# Patient Record
Sex: Female | Born: 1985 | Race: White | Hispanic: No | Marital: Married | Smoking: Never smoker
Health system: Southern US, Community
[De-identification: ages and names within clinical notes are randomized; demographics above are authoritative.]

## PROBLEM LIST (undated history)

## (undated) DIAGNOSIS — F411 Generalized anxiety disorder: Secondary | ICD-10-CM

## (undated) DIAGNOSIS — K219 Gastro-esophageal reflux disease without esophagitis: Secondary | ICD-10-CM

## (undated) DIAGNOSIS — B019 Varicella without complication: Secondary | ICD-10-CM

## (undated) DIAGNOSIS — R0781 Pleurodynia: Secondary | ICD-10-CM

## (undated) DIAGNOSIS — F419 Anxiety disorder, unspecified: Secondary | ICD-10-CM

## (undated) DIAGNOSIS — K8012 Calculus of gallbladder with acute and chronic cholecystitis without obstruction: Secondary | ICD-10-CM

## (undated) DIAGNOSIS — J309 Allergic rhinitis, unspecified: Secondary | ICD-10-CM

## (undated) DIAGNOSIS — G4733 Obstructive sleep apnea (adult) (pediatric): Secondary | ICD-10-CM

## (undated) DIAGNOSIS — E039 Hypothyroidism, unspecified: Secondary | ICD-10-CM

## (undated) HISTORY — DX: Generalized anxiety disorder: F41.1

## (undated) HISTORY — DX: Varicella without complication: B01.9

## (undated) HISTORY — PX: WISDOM TOOTH EXTRACTION: SHX21

## (undated) HISTORY — DX: Gastro-esophageal reflux disease without esophagitis: K21.9

## (undated) MED ORDER — FLUTICASONE 50 MCG/ACTUATION NASAL SPRAY, SUSP
50 mcg/actuation | Freq: Every day | NASAL | Status: DC
Start: ? — End: 2016-04-24

---

## 2009-09-26 MED ORDER — CYCLOBENZAPRINE 10 MG TAB
10 mg | ORAL_TABLET | Freq: Three times a day (TID) | ORAL | Status: DC | PRN
Start: 2009-09-26 — End: 2016-04-24

## 2009-09-26 NOTE — Patient Instructions (Addendum)
English   Spanish  Neck Spasm: Exercises  Your Care Instructions  Here are some examples of typical rehabilitation exercises for your condition. Start each exercise slowly. Ease off the exercise if you start to have pain.  Your doctor or physical therapist will tell you when you can start these exercises and which ones will work best for you.  How to do the exercises  Levator scapula stretch      1. Sit in a firm chair, or stand up straight.   2. Gently tilt your head toward your left shoulder.   3. Turn your head to look down into your armpit, bending your head slightly forward. Let the weight of your head stretch your neck muscles.   4. Hold for 15 to 30 seconds.   5. Return to your starting position.   6. Follow the same instructions above, but tilt your head toward your right shoulder.   7. Repeat 2 to 4 times toward each shoulder.  Upper trapezius stretch      1. Sit in a firm chair, or stand up straight.   2. This stretch works best if you keep your shoulder down as you lean away from it. To help you remember to do this, start by relaxing your shoulders and lightly holding on to your thighs or your chair.   3. Tilt your head toward your shoulder and hold for 15 to 30 seconds. Let the weight of your head stretch your muscles.   4. If you would like a little added stretch, place your arm behind your back. Use the arm opposite of the direction you are tilting your head. For example, if you are tilting your head to the left, place your right arm behind your back.   5. Repeat 2 to 4 times toward each shoulder.  Neck rotation      1. Sit in a firm chair, or stand up straight.   2. Keeping your chin level, turn your head to the right, and hold for 15 to 30 seconds.   3. Turn your head to the left, and hold for 15 to 30 seconds.   4. Repeat 2 to 4 times to each side.  Chin tuck      1. Lie on the floor with a rolled-up towel under your neck. Your head should be touching the floor.    2. Slowly bring your chin toward the front of your neck.   3. Hold for a count of 6, and then relax for up to 10 seconds.   4. Repeat 8 to 12 times.  Forward neck flexion      1. Sit in a firm chair, or stand up straight.   2. Bend your head forward.   3. Hold for 15 to 30 seconds, then return to your starting position.   4. Repeat 2 to 4 times.  Follow-up care is a key part of your treatment and safety. Be sure to make and go to all appointments, and call your doctor if you are having problems. It's also a good idea to know your test results and keep a list of the medicines you take.     Where can you learn more?   Go to [email or web address]  Enter P962 in the search box to learn more about "Neck Spasm: Exercises."   ?? 1995-2010 [employer]. Care instructions adapted under license by [employer] (which disclaims liability or warranty for this information). This care instruction is for use with your licensed healthcare  professional. If you have questions about a medical condition or this instruction, always ask your healthcare professional. Healthwise disclaims any warranty or liability for your use of this information.  Content Version: 8.8.72453; Last Revised: May 10, 2010English   Spanish  Neck: Exercises  Your Care Instructions  Here are some examples of typical rehabilitation exercises for your condition. Start each exercise slowly. Ease off the exercise if you start to have pain.  Your doctor or physical therapist will tell you when you can start these exercises and which ones will work best for you.  How to do the exercises  Note: Stretching should make you feel a gentle stretch, but no pain. Stop any strengthening exercise that makes pain worse.  Neck stretch      1. This stretch works best if you keep your shoulder down as you lean away from it. To help you remember to do this, start by relaxing your shoulders and lightly holding on to your thighs or your chair.    2. Tilt your head toward your shoulder and hold for 15 to 30 seconds. Let the weight of your head stretch your muscles.   3. If you would like a little added stretch, use your hand to gently and steadily pull your head toward your shoulder. For example, keeping your right shoulder down, lean your head to the left.   4. Repeat 2 to 4 times toward each shoulder.  Diagonal neck stretch      1. Turn your head slightly toward the direction you will be stretching, and tilt your head diagonally toward your chest and hold for 15 to 30 seconds.   2. If you would like a little added stretch, use your hand to gently and steadily pull your head forward on the diagonal.   3. Repeat 2 to 4 times toward each side.  Dorsal glide stretch      1. Sit or stand tall and look straight ahead.   2. Slowly tuck your chin as you glide your head backward over your body   3. Hold for a count of 6, and then relax for up to 10 seconds.   4. Repeat 8 to 12 times.  Note: The dorsal glide stretches the back of the neck. If you feel pain, do not glide so far back. Some people find this exercise easier to do while lying on their backs with an ice pack on the neck.  Chest and shoulder stretch      1. Sit or stand tall and glide your head backward as in the dorsal glide stretch.   2. Raise both arms so that your hands are next to your ears.   3. Take a deep breath, and as you breathe out, lower your elbows down and behind your back. You will feel your shoulder blades slide down and together, and at the same time you will feel a stretch across your chest and the front of your shoulders.   4. Hold for about 6 seconds, and then relax for up to 10 seconds.   5. Repeat 8 to 12 times.  Strengthening: Hands on head      1. Move your head backward, forward, and side to side against gentle pressure from your hands, holding each position for about 6 seconds.   2. Repeat 8 to 12 times.   Follow-up care is a key part of your treatment and safety. Be sure to make and go to all appointments, and call your doctor if you  are having problems. It's also a good idea to know your test results and keep a list of the medicines you take.     Where can you learn more?   Go to [email or web address]  Enter P975 in the search box to learn more about "Neck: Exercises."   ?? 1995-2010 [employer], [employer]. Care instructions adapted under license by [employer] (which disclaims liability or warranty for this information). This care instruction is for use with your licensed healthcare professional. If you have questions about a medical condition or this instruction, always ask your healthcare professional. Healthwise disclaims any warranty or liability for your use of this information.  Content Version: 8.8.72453; Last Revised: April 09, 2008      MyChart Activation    Thank you for requesting access to MyChart. Please follow the instructions below to securely access and download your online medical record. MyChart allows you to send messages to your doctor, view your test results, renew your prescriptions, schedule appointments, and more.    How Do I Sign Up?    1. In your internet browser, go to [email or web address].  2. Click on the First Time User? Click Here link in the Sign In box. You will see the New Member Sign Up page.  3. Enter your MyChart Access Code exactly as it appears below. You will not need to use this code after you???ve completed the sign-up process. If you do not sign up before the expiration date, you must request a new code.    MyChart Access Code: [email or web address]  Expires: 11/25/09 10:22 AM     4. Enter the last four digits of your Social Security Number (xxxx) and Date of Birth (mm/dd/yyyy) as indicated and click Submit. You will be taken to the next sign-up page.   5. Create a MyChart ID. This will be your MyChart login ID and cannot be changed, so think of one that is secure and easy to remember.  6. Create a MyChart password. You can change your password at any time.  7. Enter your Password Reset Question and Answer. This can be used at a later time if you forget your password.   8. Enter your e-mail address. You will receive e-mail notification when new information is available in MyChart.  9. Click Sign Up. You can now view and download portions of your medical record.  10. Click the Download Summary menu link to download a portable copy of your medical information.    Additional Information    If you have questions, please call [phone]. Remember, MyChart is NOT to be used for urgent needs. For medical emergencies, dial 911.

## 2009-09-26 NOTE — Progress Notes (Signed)
Pt here today to establish care and to address neck pain which pt assumes is related to stress

## 2009-09-26 NOTE — Progress Notes (Signed)
HISTORY OF PRESENT ILLNESS  Erin Townsend is a 24 y.o. female.  HPI  Presents to est care  Neck pain for a several months - at least since last summer  No injury, no repetitive activity  Most prominent in AM or late in the evening  Self massage has temporary relief  No meds taken.  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    In school full time and working.    Dx IBS in the past - but sx now stable.    Planning to get pregnant.    Past medical, Social, and Family history reviewed  Medications reviewed and updated.    ROS  Complete ROS reviewed and negative except as noted in HPI.    Physical Exam   Nursing note and vitals reviewed.  Constitutional: She is oriented to person, place, and time. She appears well-nourished. No distress.   HENT:   Head: Normocephalic and atraumatic.   Mouth/Throat: Oropharynx is clear and moist. No oropharyngeal exudate.   Eyes: Extraocular motions are normal. Pupils are equal, round, and reactive to light. No scleral icterus.   Neck: Normal range of motion. Neck supple. No JVD present.   Cardiovascular: Normal rate, regular rhythm and normal heart sounds.  Exam reveals no gallop and no friction rub.    No murmur heard.  Pulmonary/Chest: Effort normal and breath sounds normal. No respiratory distress. She has no wheezes. She has no rales.   Abdominal: Soft. Bowel sounds are normal. She exhibits no distension and no mass. No tenderness. She has no guarding.   Musculoskeletal: Normal range of motion. She exhibits no edema.   Lymphadenopathy:     She has no cervical adenopathy.   Neurological: She is alert and oriented to person, place, and time. She exhibits normal muscle tone. Coordination normal.   Skin: Skin is warm. No rash noted.    Psychiatric: She has a normal mood and affect.       ASSESSMENT and PLAN  1. Neck pain (723.1B)  cyclobenzaprine (FLEXERIL) 10 mg tablet, CBC WITH AUTOMATED DIFF, METABOLIC PANEL, COMPREHENSIVE, CK, XR SPINE CERVICAL COMP WITH OBLIQUES   2. Anxiety (300.00E)  REFERRAL TO PSYCHOLOGY, T4, FREE, TSH, 3RD GENERATION   3. IBS (irritable bowel syndrome) (564.1W)     4. Allergic rhinitis (477.9AD)  cetirizine (ZYRTEC) 10 mg tablet   5. Lipid screening (V77.91D)  LIPID PANEL   6. Pregnancy examination or test (V72.40D)  AMB POC URINE PREGNANCY TEST, VISUAL COLOR COMPARISON       Follow-up Disposition:  Return in about 3 months (around 12/27/2009), or if symptoms worsen or fail to improve.  lab results and schedule of future lab studies reviewed with patient  reviewed diet, exercise and weight control  cardiovascular risk and specific lipid/LDL goals reviewed  reviewed medications and side effects in detail  Flexeril - preg class B  Heat and NSAIDs as well  Neck exercises  Counseled re: sleep position and mechanics  Ref counseling  Reluctant to try SSRI or other med for fear of side effects  Pt to let us know re: Td vs Tdap and date given

## 2009-09-27 LAB — CBC WITH AUTOMATED DIFF
ABS. BASOPHILS: 0 10*3/uL (ref 0.0–0.2)
ABS. EOSINOPHILS: 0.2 10*3/uL (ref 0.0–0.4)
ABS. IMM. GRANS.: 0 10*3/uL (ref 0.0–0.1)
ABS. LYMPHOCYTES: 1.5 10*3/uL (ref 0.7–4.5)
ABS. MONOCYTES: 0.4 10*3/uL (ref 0.1–1.0)
ABS. NEUTROPHILS: 4.1 10*3/uL (ref 1.8–7.8)
BASOPHILS: 0 % (ref 0–3)
EOSINOPHILS: 3 % (ref 0–7)
HCT: 35.6 % (ref 34.0–44.0)
HGB: 12.1 g/dL (ref 11.5–15.0)
IMMATURE GRANULOCYTES: 0 % (ref 0–1)
LYMPHOCYTES: 25 % (ref 14–46)
MCH: 31.3 pg (ref 27.0–34.0)
MCHC: 34 g/dL (ref 32.0–36.0)
MCV: 92 fL (ref 80–98)
MONOCYTES: 6 % (ref 4–13)
NEUTROPHILS: 66 % (ref 40–74)
PLATELET: 219 10*3/uL (ref 140–415)
RBC: 3.87 x10E6/uL (ref 3.80–5.10)
RDW: 12.7 % (ref 11.7–15.0)
WBC: 6.2 10*3/uL (ref 4.0–10.5)

## 2009-09-27 LAB — METABOLIC PANEL, COMPREHENSIVE
A-G Ratio: 1.4 (ref 1.1–2.5)
ALT (SGPT): 11 IU/L (ref 0–40)
AST (SGOT): 10 IU/L (ref 0–40)
Albumin: 4.3 g/dL (ref 3.5–5.5)
Alk. phosphatase: 62 IU/L (ref 25–150)
BUN/Creatinine ratio: 14 (ref 8–20)
BUN: 9 mg/dL (ref 6–20)
Bilirubin, total: 0.3 mg/dL (ref 0.0–1.2)
CO2: 23 mmol/L (ref 20–32)
Calcium: 9.1 mg/dL (ref 8.7–10.2)
Chloride: 104 mmol/L (ref 97–108)
Creatinine: 0.63 mg/dL (ref 0.57–1.00)
GLOBULIN, TOTAL: 3.1 g/dL (ref 1.5–4.5)
Glucose: 88 mg/dL (ref 65–99)
Potassium: 4 mmol/L (ref 3.5–5.2)
Protein, total: 7.4 g/dL (ref 6.0–8.5)
Sodium: 138 mmol/L (ref 135–145)

## 2009-09-27 LAB — CK: Creatine Kinase,Total: 55 U/L (ref 24–173)

## 2009-09-27 LAB — LIPID PANEL
Cholesterol, total: 151 mg/dL (ref 100–199)
HDL Cholesterol: 40 mg/dL (ref >39)
LDL, calculated: 94 mg/dL (ref 0–99)
Triglyceride: 83 mg/dL (ref 0–149)
VLDL, calculated: 17 mg/dL (ref 5–40)

## 2009-09-27 LAB — TSH 3RD GENERATION: TSH: 1.71 u[IU]/mL (ref 0.450–4.500)

## 2009-09-27 LAB — T4, FREE: T4, Free: 1.2 ng/dL (ref 0.82–1.77)

## 2011-04-16 NOTE — Progress Notes (Signed)
Influenza vaccination.

## 2012-09-03 ENCOUNTER — Telehealth

## 2012-09-03 NOTE — Telephone Encounter (Signed)
Benadryl is contraindicated in breast feeding due to both excretion into the milk and infant sedation or side effects and it may also dry up the breast milk a little.  Other antihistamines like claritin are also excreted in small amounts into breast milk, so theoretically could cause a problem.    astelin has no testing to support it either way.    Nasal steroids like flonase have not been tested, but inhaled steroids are generally considered ok.  So, starting flonase may be the safest and happens to be the best treatment for allergies as well.    flonase must be used daily to be effective and should show results in 4-5 days    I can send a Rx to the pharmacy.    Please notify pt.

## 2012-09-03 NOTE — Telephone Encounter (Incomplete)
Patient has a bad cold and wants to know what medications over the counter she could take to help her. Please contact patient at [phone].

## 2012-09-03 NOTE — Telephone Encounter (Signed)
Pt calling back for a second time, but message was never routed. She states that she is currently nursing her child, but has allergies/coughing/congestion. She just wants to know if it is okay for her to take Benadryl while she is nursing.    She would like a call back regarding this, at [phone].

## 2012-09-04 NOTE — Telephone Encounter (Signed)
msg left notifying pt to call office to go over recommendations that Dr. Langston Masker has

## 2013-11-26 MED ORDER — ALPRAZOLAM 0.25 MG TAB
0.25 mg | ORAL_TABLET | Freq: Two times a day (BID) | ORAL | Status: DC | PRN
Start: 2013-11-26 — End: 2014-04-16

## 2013-11-26 MED ORDER — CITALOPRAM 40 MG TAB
40 mg | ORAL_TABLET | Freq: Every day | ORAL | Status: DC
Start: 2013-11-26 — End: 2014-12-04

## 2013-11-26 NOTE — Progress Notes (Signed)
Subjective:   28 y.o. female for annual medical exam.   Her gyne and breast care is done elsewhere by her Ob-Gyne physician.    Past medical, Social, and Family history reviewed  Medications reviewed and updated.     ROS: Feeling generally well. No TIA's or unusual headaches, no dysphagia.  No prolonged cough. No dyspnea or chest pain on exertion.  No abdominal pain, change in bowel habits, black or bloody stools.  No urinary tract symptoms.      Specific concerns today:   Anxiety - stable, but uses xanax 1-2 x per week      Neck pain is not resolved, but better with exercise, etc.  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  BP 113/77 mmHg   Pulse 78   Temp(Src) 98.5 ??F (36.9 ??C) (Oral)   Resp 20   Ht 5' 2.5" (1.588 m)   Wt 151 lb (68.493 kg)   BMI 27.16 kg/m2   SpO2 99%   LMP 10/30/2013 (Approximate)  ENT normal.  Neck supple. No adenopathy or thyromegaly. PERLA. Lungs are clear, good air entry, no wheezes, rhonchi or rales. S1 and S2 normal, no murmurs, regular rate and rhythm. Abdomen soft without tenderness, guarding, mass or organomegaly. Extremities show no edema, normal peripheral pulses. Neurological is normal, no focal findings.  Breast and Pelvic exams are deferred.      Prior labs reviewed.      Assessment/Plan:   Well Woman  follow low fat diet, routine labs ordered, call if any problems    ICD-9-CM    1. Well woman exam V70.0 CBC WITH AUTOMATED DIFF     LIPID PANEL     METABOLIC PANEL, COMPREHENSIVE   2. Anxiety 300.00 citalopram (CELEXA) 40 mg tablet     ALPRAZolam (XANAX) 0.25 mg tablet   3. Allergic rhinitis, unspecified allergic rhinitis type 477.9    4. Neck pain 723.1 XR SPINE CERV 4 OR 5 V   5. IBS (irritable bowel syndrome) 564.1    6. 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  lab results and schedule of future lab studies reviewed with patient  reviewed diet, exercise and weight    cardiovascular risk and specific lipid/LDL goals reviewed  reviewed medications and side effects in detail   Neck films re-ordered  [facility]  Reviewed options for milk intolerance  Agree to manage SSRI and benzo prn  If difficulty managing mood then re-refer to psych

## 2013-11-26 NOTE — Progress Notes (Signed)
Room 15. Patient presents for physical, pt has been having abdominal pain/cramps when drinking milk. Pt has no c/o pain or illness at this time. In addition to med list, pt is taking birth control pills, unsure of name. Pt does not have Advanced Directive.       1. Have you been to the ER, urgent care clinic since your last visit?  Hospitalized since your last visit?No    2. Have you seen or consulted any other health care providers outside of the [facility] since your last visit?  Include any pap smears or colon screening. Yes When: Jan 2015 psychiatrist Dr. Corwin LevinsMarkowitz. Also OBGYN 1 month ago.

## 2014-01-05 NOTE — Progress Notes (Signed)
Patient in for fasting labs.

## 2014-01-06 LAB — CBC WITH AUTOMATED DIFF
ABS. BASOPHILS: 0 10*3/uL (ref 0.0–0.2)
ABS. EOSINOPHILS: 0.1 10*3/uL (ref 0.0–0.4)
ABS. IMM. GRANS.: 0 10*3/uL (ref 0.0–0.1)
ABS. MONOCYTES: 0.5 10*3/uL (ref 0.1–0.9)
ABS. NEUTROPHILS: 1.9 10*3/uL (ref 1.4–7.0)
Abs Lymphocytes: 2.9 10*3/uL (ref 0.7–3.1)
BASOPHILS: 1 %
EOSINOPHILS: 1 %
HCT: 35.5 % (ref 34.0–46.6)
HGB: 11.7 g/dL (ref 11.1–15.9)
IMMATURE GRANULOCYTES: 0 %
Lymphocytes: 53 %
MCH: 30 pg (ref 26.6–33.0)
MCHC: 33 g/dL (ref 31.5–35.7)
MCV: 91 fL (ref 79–97)
MONOCYTES: 9 %
NEUTROPHILS: 36 %
PLATELET: 175 10*3/uL (ref 150–379)
RBC: 3.9 x10E6/uL (ref 3.77–5.28)
RDW: 13.7 % (ref 12.3–15.4)
WBC: 5.4 10*3/uL (ref 3.4–10.8)

## 2014-01-06 LAB — METABOLIC PANEL, COMPREHENSIVE
A-G Ratio: 1.4 (ref 1.1–2.5)
ALT (SGPT): 33 IU/L — ABNORMAL HIGH (ref 0–32)
AST (SGOT): 21 IU/L (ref 0–40)
Albumin: 4.2 g/dL (ref 3.5–5.5)
Alk. phosphatase: 59 IU/L (ref 39–117)
BUN/Creatinine ratio: 18 (ref 8–20)
BUN: 13 mg/dL (ref 6–20)
Bilirubin, total: 0.3 mg/dL (ref 0.0–1.2)
CO2: 23 mmol/L (ref 18–29)
Calcium: 8.7 mg/dL (ref 8.7–10.2)
Chloride: 101 mmol/L (ref 97–108)
Creatinine: 0.72 mg/dL (ref 0.57–1.00)
GLOBULIN, TOTAL: 3 g/dL (ref 1.5–4.5)
Glucose: 88 mg/dL (ref 65–99)
Potassium: 3.9 mmol/L (ref 3.5–5.2)
Protein, total: 7.2 g/dL (ref 6.0–8.5)
Sodium: 139 mmol/L (ref 134–144)

## 2014-01-06 LAB — LIPID PANEL
Cholesterol, total: 170 mg/dL (ref 100–199)
HDL Cholesterol: 33 mg/dL — ABNORMAL LOW (ref >39)
LDL, calculated: 93 mg/dL (ref 0–99)
Triglyceride: 222 mg/dL — ABNORMAL HIGH (ref 0–149)
VLDL, calculated: 44 mg/dL — ABNORMAL HIGH (ref 5–40)

## 2014-03-18 ENCOUNTER — Ambulatory Visit
Admit: 2014-03-18 | Discharge: 2014-03-18 | Payer: PRIVATE HEALTH INSURANCE | Attending: Internal Medicine | Primary: Internal Medicine

## 2014-03-18 DIAGNOSIS — R0781 Pleurodynia: Secondary | ICD-10-CM

## 2014-03-18 LAB — D-DIMER, QUANTITATIVE: D-Dimer, Quant: 1.6 mg/L FEU — ABNORMAL HIGH (ref 0.00–0.49)

## 2014-03-18 LAB — D DIMER: D-dimer: 1.6 mg/L FEU — ABNORMAL HIGH (ref 0.00–0.49)

## 2014-03-18 LAB — AMB POC SOFIA INFLUENZA A/B TEST
Influenza A Ag POC: NEGATIVE Pos/Neg
Influenza B Ag POC: NEGATIVE Pos/Neg

## 2014-03-18 NOTE — Telephone Encounter (Signed)
[facility], call [employer] to get approval for imaging, if not to have pt go to ER if having bad chest pains.

## 2014-03-18 NOTE — Telephone Encounter (Signed)
S/w pt husband, explained that [facility] need to see if we can get it approved, it may take 24- 48 hr for a PA. If she is having bad chest pain, she  needs to be evaluated sooner she can go to Er to be seen.  Phone went dead.    Called pt back at [phone], l/m to have pt to call back office  Called back to [phone], s/w husband, states he hung up phone, he will have insurance call office to figure it out.

## 2014-03-18 NOTE — Telephone Encounter (Signed)
Spoke with ED provider Dr. Skipper Clicheutter at [phone] (main number) of [facility] (pt's preferred ED location for CT):    Reviewed eval today and plan for evaluation there with CT.  Given D-dimer result and plan for CT.    Provider there voiced understanding of plan.

## 2014-03-18 NOTE — Patient Instructions (Signed)
Obtain chest x-ray today as reviewed.  Labs, once resulted, will be called to you.  If elevated/abnormal, will order/recommend additional imaging as reviewed today at visit.    If pain or shortness of breath worsens, call/return to clinic, or have evaluation in urgent care/ED as reviewed.

## 2014-03-18 NOTE — Progress Notes (Signed)
RM # 17      Patient in today with complain of chest pain without shortness of breath. She states it started this morning about 8:00 a.m. When she woke up.  Patient describes it as a "tightness in the center of my chest".  Patient does not complain of N&V, neck or arm pain.  Pain is mostly when she inhales.    1. Have you been to the ER, urgent care clinic since your last visit?  Hospitalized since your last visit?No    2. Have you seen or consulted any other health care providers outside of the [facility] since your last visit?  Include any pap smears or colon screening. No     Patient received her flu vaccine in September at her place of employment, she will provide copy to this practice.    Patient does not have an advance directive or living will in place, patient declined information.

## 2014-03-18 NOTE — Telephone Encounter (Signed)
Call made tp pt insurance s/w Cyndi Benderen H., Ref # [phone]. Per rep does not require a PA for the CT imaging cpt code [id number].       Called made to Concierge while on the phone with husband to get pt scheduled for this evening 10/22. Per husband's request pt was scheduled for 10/23 in the morning.  He states, " pt is at work and tomorrow will work better".    Number given to the concierge in case she needs to reschedule also info given to the [place] imaging location.

## 2014-03-18 NOTE — Telephone Encounter (Signed)
Spoke with pt at 1900--reviewed findings and plan.    Reviewed elevated D-dimer, possible pulmonary embolus/clot as etiology of pain, and risk of not having evaluated ASAP.  She was readily agreeable to additional eval at this time.  Reviewed significance of elevation in lab (D-dimer) and need to eval pain with CT tonight.    [facility]  [facility] to review plan and evaluation.    Reviewed with pt to let them know Dr. Langston MaskerMorris PCP, so we could retrieve records tomorrow.

## 2014-03-18 NOTE — Progress Notes (Signed)
HISTORY OF PRESENT ILLNESS  Erin Townsend is a 28 y.o. female.    HPI  Here for evaluation chest pain.    Noted this AM only.  She awakened from sleep and felt normal, then noted pain later after waking up.  She notes pain in chest like a tightening when she breathes.    Notes primarily in upper mid-chest.  Worse with deeper breathing hurts more.  She notes can't "catch a good breath".  No history of wheezing noted.    Notes has had cough and sniffles for past few days.  No fever. No rhinorrhea.  No productive cough.    No prior chest problems/injuries, breathing problems.  No cardiac problems noted.  She notes seen by a specialist when younger, for fast heart rate--this hasn't happened in years.  She notes had EKG and 24-hr Holter which she reported showed "skipped beats".  This notes when ~[age]yrs.        ROS      Blood pressure 100/60, pulse 102, temperature 98.4 ??F (36.9 ??C), temperature source Oral, resp. rate 24, height 5' 2.5" (1.588 m), weight 150 lb 3.2 oz (68.13 kg), last menstrual period 03/18/2014, SpO2 98 %.    Physical Exam   Constitutional: She appears well-developed and well-nourished. No distress.   HENT:   Head: Normocephalic and atraumatic.   Right Ear: External ear normal.   Left Ear: External ear normal.   Mouth/Throat: Oropharynx is clear and moist. No oropharyngeal exudate.   Eyes: Conjunctivae are normal. Right eye exhibits no discharge. Left eye exhibits no discharge. No scleral icterus.   Neck: Normal range of motion. Neck supple. No tracheal deviation present. No thyromegaly present.   Cardiovascular: Normal rate, regular rhythm, normal heart sounds and intact distal pulses.  Exam reveals no gallop and no friction rub.    No murmur heard.  Pulmonary/Chest: Effort normal and breath sounds normal. No stridor. No respiratory distress. She has no wheezes. She has no rales. She exhibits no tenderness.   No forced expiratory wheezes noted bilaterally.    Abdominal: Soft. Bowel sounds are normal. She exhibits no distension. There is no tenderness. There is no rebound and no guarding.   Musculoskeletal: She exhibits no edema or tenderness.   No calf pain, pain with foot dorsiflexion noted bilat.  No calf or LE swelling noted bilat.   Lymphadenopathy:     She has no cervical adenopathy.   Neurological: She is alert. She exhibits normal muscle tone. Coordination normal.   Skin: Skin is warm. No rash noted. She is not diaphoretic. No erythema. No pallor.   Psychiatric: She has a normal mood and affect. Her behavior is normal. Judgment and thought content normal.     EKG reviewed at visit--normal.  HR 86 on EKG with no ST-T changes.      Results for orders placed or performed in visit on 03/18/14   AMB POC SOFIA INFLUENZA A/B TEST   Result Value Ref Range    VALID INTERNAL CONTROL POC Yes     Influenza A Ag POC Negative Pos/Neg    Influenza B Ag POC Negative Pos/Neg         ASSESSMENT and PLAN      ICD-10-CM ICD-9-CM    1. Pleuritic chest pain R07.81 786.52 AMB POC SOFIA INFLUENZA A/B TEST      AMB POC EKG ROUTINE W/ 12 LEADS, INTER & REP      D DIMER      CBC WITH  AUTOMATED DIFF      XR CHEST PA LAT      CTA CHEST W WO CONT--ordered once D-dimer resulted/elevated.   2. History of tachycardia Z87.898 V13.89 AMB POC EKG ROUTINE W/ 12 LEADS, INTER & REP   3. Viral URI with cough J06.9 465.9 AMB POC SOFIA INFLUENZA A/B TEST      XR CHEST PA LAT       1.  Eval reviewed with pt during visit.  Reviewed with pt PCP after visit in clinic.    2.  EKG normal.  Reviewed with pt--copy to pt at visit.    3.  Reviewed CXR.  Influenza testing completed after pt left clinic--negative.        Follow-up Disposition:  Return if symptoms worsen or fail to improve.  lab results and schedule of future lab studies reviewed with patient  reviewed medications and side effects in detail  radiology results and schedule of future radiology studies reviewed with patient     For additional documentation of information and/or recommendations discussed this visit, please see notes in instructions.      Addendum--day of visit--1540:  Unable to reach pt on her listed number: [phone].  VM left.  Unable to reach husband on his number (on HIPAA)--also left VM to contact clinic.  Husband's number [phone].

## 2014-03-19 ENCOUNTER — Inpatient Hospital Stay: Payer: PRIVATE HEALTH INSURANCE | Primary: Internal Medicine

## 2014-03-19 LAB — CBC WITH AUTOMATED DIFF
ABS. BASOPHILS: 0 10*3/uL (ref 0.0–0.2)
ABS. EOSINOPHILS: 0.1 10*3/uL (ref 0.0–0.4)
ABS. IMM. GRANS.: 0 10*3/uL (ref 0.0–0.1)
ABS. MONOCYTES: 0.5 10*3/uL (ref 0.1–0.9)
ABS. NEUTROPHILS: 4 10*3/uL (ref 1.4–7.0)
Abs Lymphocytes: 1.7 10*3/uL (ref 0.7–3.1)
BASOPHILS: 0 %
EOSINOPHILS: 2 %
HCT: 36.9 % (ref 34.0–46.6)
HGB: 12.6 g/dL (ref 11.1–15.9)
IMMATURE GRANULOCYTES: 0 %
Lymphocytes: 27 %
MCH: 29.8 pg (ref 26.6–33.0)
MCHC: 34.1 g/dL (ref 31.5–35.7)
MCV: 87 fL (ref 79–97)
MONOCYTES: 8 %
NEUTROPHILS: 63 %
PLATELET: 192 10*3/uL (ref 150–379)
RBC: 4.23 x10E6/uL (ref 3.77–5.28)
RDW: 13.6 % (ref 12.3–15.4)
WBC: 6.4 10*3/uL (ref 3.4–10.8)

## 2014-03-19 LAB — AMB EXT CREATININE
Creatinine, External: 0.8
Creatinine, External: 0.84

## 2014-03-19 NOTE — Telephone Encounter (Signed)
[facility] records reveiwed/requested.    CT done to rule out Pulmonary embolus and normal study.  Pt had normal vitals there with pulse ox 98-100%.  Normal CMP, Urine pregnancy, POC BMP, POC troponin.  POC Hgb/Hct also low consiistent with heme 8.    Heme 8 with hgb 11.6, plt 169K (with low-normal 175K).     Treated with hydrocodone/acetaminophen PRN for costochondritie, MSK pain.

## 2014-04-01 ENCOUNTER — Encounter

## 2014-04-17 MED ORDER — ALPRAZOLAM 0.25 MG TAB
0.25 mg | ORAL_TABLET | ORAL | Status: DC
Start: 2014-04-17 — End: 2014-06-01

## 2014-04-17 NOTE — Telephone Encounter (Signed)
Medication refill authorized.    Please call in since cannot be sent electronically

## 2014-04-19 NOTE — Progress Notes (Signed)
rx called into pharmacy

## 2014-06-02 MED ORDER — ALPRAZOLAM 0.25 MG TAB
0.25 mg | ORAL_TABLET | ORAL | Status: DC
Start: 2014-06-02 — End: 2014-09-13

## 2014-06-02 NOTE — Telephone Encounter (Signed)
Medication refill authorized.    Please call in since cannot be sent electronically

## 2014-06-02 NOTE — Telephone Encounter (Signed)
Rx called into pharmacy

## 2014-06-11 ENCOUNTER — Ambulatory Visit
Admit: 2014-06-11 | Discharge: 2014-06-11 | Payer: PRIVATE HEALTH INSURANCE | Attending: Internal Medicine | Primary: Internal Medicine

## 2014-06-11 DIAGNOSIS — H109 Unspecified conjunctivitis: Secondary | ICD-10-CM

## 2014-06-11 MED ORDER — CIPROFLOXACIN 0.3 % EYE DROPS
0.3 % | Freq: Four times a day (QID) | OPHTHALMIC | Status: AC
Start: 2014-06-11 — End: 2014-06-16

## 2014-06-11 MED ORDER — AZITHROMYCIN 250 MG TAB
250 mg | ORAL_TABLET | ORAL | Status: DC
Start: 2014-06-11 — End: 2014-12-05

## 2014-06-11 NOTE — Patient Instructions (Addendum)
Once eye symptoms (redness, drainage) resolve off eye drops for 1-2 days, may resume contact wear with new contacts.  Continue glasses wear until then.      Pinkeye From Bacteria in Teens: Care Instructions  Your Care Instructions     Erin Townsend is a problem that many teens get. In pinkeye, the lining of your eyelid and the eye surface become red and swollen. The lining is called the conjunctiva (say "kawn-junk-TY-vuh"). Pinkeye is also called conjunctivitis (say "kun-JUNK-tih-VY-tus").  Pinkeye can be caused by bacteria, a virus, or an allergy.  Your pinkeye is caused by bacteria. This type of pinkeye can spread quickly from person to person, usually from touching.  Pinkeye from bacteria usually clears up 2 to 3 days after you start treatment with antibiotic eyedrops or ointment.  Follow-up care is a key part of your treatment and safety. Be sure to make and go to all appointments, and call your doctor if you are having problems. It's also a good idea to know your test results and keep a list of the medicines you take.  How can you care for yourself at home?  Use antibiotics as directed  If the doctor gave you antibiotic medicine, such as an ointment or eyedrops, use it as directed. Do not stop using it just because your eyes start to look better. You need to take the full course of antibiotics. Keep the bottle tip clean.  To put in eyedrops or ointment:  ?? Tilt your head back and pull your lower eyelid down with one finger.  ?? Drop or squirt the medicine inside the lower lid.  ?? Close your eye for 30 to 60 seconds to let the drops or ointment move around.  ?? Do not touch the tip of the bottle or tube to your eye, eyelid, eyelashes, or any other surface.  Make yourself comfortable  ?? Use moist cotton or a clean, wet cloth to remove the crust from your eyes. Wipe from the inside corner of your eye to the outside. Use a clean part of the cloth for each wipe.   ?? Put cold or warm wet cloths on your eyes a few times a day if your eyes hurt or are itching.  ?? Do not wear contact lenses until your pinkeye is gone. Clean the contacts and storage case.  ?? If you wear disposable contacts, get out a new pair when your eyes have cleared and it is safe to wear contacts again.  Prevent pinkeye from spreading  ?? Wash your hands often. Always wash them before and after you treat pinkeye or touch your eyes or face.  ?? Don't share towels, pillows, or washcloths while you have pinkeye. Use clean linens, towels, and washcloths each day.  ?? Do not share your contact lens equipment, containers, or solutions.  ?? Do not share your eye medicine.  When should you call for help?  Call your doctor now or seek immediate medical care if:  ?? You have pain in your eye, not just irritation on the surface.  ?? You have a change in vision or a loss of vision.  ?? Your eye gets worse or is not better within 48 hours after you started antibiotics.  Watch closely for changes in your health, and be sure to contact your doctor if you have any problems.   Where can you learn more?   Go to [email or web address]  Enter F054 in the search box to learn more about "Pinkeye From Bacteria in  Teens: Care Instructions."   ?? 2006-2015 [employer]. Care instructions adapted under license by [employer] (which disclaims liability or warranty for this information). This care instruction is for use with your licensed healthcare professional. If you have questions about a medical condition or this instruction, always ask your healthcare professional. Healthwise, Incorporated disclaims any warranty or liability for your use of this information.  Content Version: 10.7.482551; Current as of: Oct 16, 2013

## 2014-06-11 NOTE — Progress Notes (Signed)
Room 12    Chief Complaint   Patient presents with   ??? Pink Eye       Patient states this morning there was a lot of drainage crusted around her eyes. Itches sometimes.      1. Have you been to the ER, urgent care clinic since your last visit?  Hospitalized since your last visit?[facility] for chest pain. Release signed.    2. Have you seen or consulted any other health care providers outside of the [facility] since your last visit?  Include any pap smears or colon screening. No     Advanced care info added to AVS.

## 2014-06-11 NOTE — Progress Notes (Addendum)
HISTORY OF PRESENT ILLNESS  Erin Townsend is a 29 y.o. female.    HPI  Here for evaluation of pink eye.    Notes onset left eye yesterday.  Had crusting today in that eye.    Notes some pain starting with some itching right, but not red.  She notes works at [employer]--one of the dad's had symptoms, but no children there.    Some URI symptoms--past week.  Worse cough today--after about 7 days URI symptoms.  No chest pain or problems breathing.    No sinus symptoms.    No fever or emesis noted.    Reports no problems prior with azithromycin.    Not wearing contacts.  Reviewed use.      ROS      Blood pressure 95/64, pulse 85, temperature 98.1 ??F (36.7 ??C), temperature source Oral, resp. rate 18, weight 153 lb 3.2 oz (69.491 kg), last menstrual period 06/01/2014.    Physical Exam   Constitutional: She appears well-developed and well-nourished. No distress.   HENT:   Head: Normocephalic and atraumatic.   Mouth/Throat: Oropharynx is clear and moist.   Eyes: Right eye exhibits no discharge. Left eye exhibits no discharge. No scleral icterus.   Mild erythema bulbar conjunctiva left eye.   Neck: Normal range of motion. Neck supple. No tracheal deviation present. No thyromegaly present.   Cardiovascular: Normal rate, regular rhythm, normal heart sounds and intact distal pulses.  Exam reveals no gallop and no friction rub.    No murmur heard.  Pulmonary/Chest: Effort normal and breath sounds normal. No stridor. No respiratory distress. She has no wheezes. She has no rales. She exhibits no tenderness.   Abdominal: Soft. Bowel sounds are normal. She exhibits no distension. There is no tenderness.   Musculoskeletal: She exhibits no edema or tenderness.   Lymphadenopathy:     She has no cervical adenopathy.   Neurological: She is alert. She exhibits normal muscle tone. Coordination normal.   Skin: Skin is warm. No rash noted. She is not diaphoretic. No erythema. No pallor.    Psychiatric: She has a normal mood and affect. Her behavior is normal. Judgment and thought content normal.           ASSESSMENT and PLAN    ICD-10-CM ICD-9-CM    1. Bacterial conjunctivitis of left eye H10.89 372.39 ciprofloxacin HCl (CILOXIN) 0.3 % ophthalmic solution    A49.9 041.9    2. Bronchitis J40 490 azithromycin (ZITHROMAX) 250 mg tablet   3. Penicillin allergy Z88.0 V14.0    4. Wears contact lenses Z97.3 V41.0        Medications reviewed at visit.      Follow-up Disposition:  Return if symptoms worsen or fail to improve.  reviewed medications and side effects in detail    For additional documentation of information and/or recommendations discussed this visit, please see notes in instructions.

## 2014-07-07 NOTE — Telephone Encounter (Signed)
Pt saw Theda BelfastChildress for pink eye 06/11/2014 and she woke up today with pink eye again. She would like the eye drops and antibiotics that was given before. Would like a call when decision is made and rx sent to pharmacy.

## 2014-07-09 NOTE — Telephone Encounter (Signed)
Reviewed--will re-evaluate pt if she returns for follow-up.  Script not refilled (cipro ophth).

## 2014-07-09 NOTE — Telephone Encounter (Signed)
Message left for patient to call back to schedule appointment.

## 2014-07-09 NOTE — Telephone Encounter (Signed)
Spoke to patient and husband and they declined appointment. Advised them of the physician recommendation for evaluation due to the reccurence of the infection

## 2014-07-09 NOTE — Telephone Encounter (Signed)
Pt is returning nurse's call.Pt's # [phone].

## 2014-07-09 NOTE — Telephone Encounter (Signed)
Last filled for pt 06-11-14.    If having recurrent symptoms needs to return her to review/re-evaluate, and/or see ophthalmology/optometry to evaluate.

## 2014-09-14 MED ORDER — ALPRAZOLAM 0.25 MG TAB
0.25 mg | ORAL_TABLET | ORAL | Status: DC
Start: 2014-09-14 — End: 2016-04-24

## 2014-09-14 NOTE — Telephone Encounter (Signed)
Medication refill authorized.    Please call in since cannot be sent electronically

## 2014-09-14 NOTE — Telephone Encounter (Signed)
Rx called into pharmacy

## 2014-12-05 MED ORDER — CITALOPRAM 40 MG TAB
40 mg | ORAL_TABLET | ORAL | Status: DC
Start: 2014-12-05 — End: 2016-04-24

## 2014-12-05 NOTE — Telephone Encounter (Signed)
Medication refill authorized.    Please encourage follow up for office visit appt.

## 2014-12-06 NOTE — Telephone Encounter (Signed)
General VM left to call office for appt.

## 2015-03-31 ENCOUNTER — Ambulatory Visit: Payer: Self-pay | Admitting: Primary Care

## 2015-04-06 ENCOUNTER — Ambulatory Visit (INDEPENDENT_AMBULATORY_CARE_PROVIDER_SITE_OTHER): Payer: Managed Care, Other (non HMO) | Admitting: Primary Care

## 2015-04-06 ENCOUNTER — Encounter: Payer: Self-pay | Admitting: Primary Care

## 2015-04-06 ENCOUNTER — Encounter (INDEPENDENT_AMBULATORY_CARE_PROVIDER_SITE_OTHER): Payer: Self-pay

## 2015-04-06 VITALS — BP 118/68 | HR 88 | Temp 98.0°F | Ht 62.0 in | Wt 143.8 lb

## 2015-04-06 DIAGNOSIS — F411 Generalized anxiety disorder: Secondary | ICD-10-CM

## 2015-04-06 DIAGNOSIS — E663 Overweight: Secondary | ICD-10-CM | POA: Insufficient documentation

## 2015-04-06 DIAGNOSIS — K219 Gastro-esophageal reflux disease without esophagitis: Secondary | ICD-10-CM

## 2015-04-06 MED ORDER — PHENTERMINE HCL 37.5 MG PO CAPS
37.5000 mg | ORAL_CAPSULE | ORAL | Status: DC
Start: 2015-04-06 — End: 2015-06-28

## 2015-04-06 NOTE — Assessment & Plan Note (Signed)
History of during pregnancies and also with trigger foods. Symptoms of epigastric discomfort, upper back pain, and nausea once weekly since September. No relief with tums and pepcid AC. Will have her do a 4 week trial of omeprazole 20 mg daily. She is to update me on my chart in 1 month.

## 2015-04-06 NOTE — Progress Notes (Signed)
Pre visit review using our clinic review tool, if applicable. No additional management support is needed unless otherwise documented below in the visit note. 

## 2015-04-06 NOTE — Progress Notes (Signed)
Subjective:    Patient ID: Elizabeth Hunt, female    DOB: 1985/09/11, 29 y.o.   MRN: [id number]  HPI  Elizabeth Hunt is a 29 year old female who presents today to establish care and discuss the problems mentioned below. Will obtain old records.  1) Heartburn: She's been taking Phentermine 37.5 mg per her GYN since May 2016. She experienced extreme "heart burn" in late September after eating fatty foods. Her symptoms include epigastric discomfort, back pain, nausea. These are the same symptoms she had during pregnancy with her son. She's been taking Tums and Pepcid AC prior to a meal with some relief. This morning she mixed some baking soda with water which helped to reduce symptoms. Since September she'sl experienced symptoms once weekly. She is aware of trigger foods and has been eating fast food daily.  2) Generalized Anxiety Disorder: Currently managed on Citalopram 40 mg tablets daily and has been managed on this medication for the past 4 years. She also has a script for Xanax that she will use sparingly. She feels well managed on her current regimen.  3) Overweight: Currently managed on Phentermine 37.5 mg since May 2016 per GYN. She's lost about 15 pounds and is close to her goal. She's not taking it daily. She's hit a plateau and hasn't lost weight in a while. Her goal is to weigh in the 130's.   Her diet currently consists of: Breakfast: Cereal, fruit bar Lunch: Fast food Dinner: Chicken (baked/grilled), pasta, mac and cheese, sandwich, limited vegetables. Snacks: Popcorn Desserts: Halloween  Beverages: Water, diet pepsi  Exercise: She sometimes exercises thee times weekly at the gym for about 1 hour.  Review of Systems  Constitutional: Negative for unexpected weight change.  HENT: Negative for rhinorrhea.   Respiratory: Negative for cough and shortness of breath.   Cardiovascular: Negative for chest pain.  Gastrointestinal: Negative for diarrhea and constipation.       GERD    Genitourinary: Negative for difficulty urinating.       Regular periods  Musculoskeletal: Negative for myalgias and arthralgias.  Skin: Negative for rash.  Neurological: Negative for dizziness, numbness and headaches.  Psychiatric/Behavioral:       See HPI. Denies concerns for depression       Past Medical History  Diagnosis Date  . Chicken pox   . Generalized anxiety disorder     Social History   Social History  . Marital Status: Married    Spouse Name: N/A  . Number of Children: N/A  . Years of Education: N/A   Occupational History  . Not on file.   Social History Main Topics  . Smoking status: Never Smoker   . Smokeless tobacco: Not on file  . Alcohol Use: 0.0 oz/week    0 Standard drinks or equivalent per week     Comment: social  . Drug Use: Not on file  . Sexual Activity: Not on file   Other Topics Concern  . Not on file   Social History Narrative   Married.   2 children.   Hebert S[place] Roe   Enjoys relaxing.    No past surgical history on file.  Family History  Problem Relation Age of Onset  . Breast cancer Mother   . Diabetes Father     Allergies  Allergen Reactions  . Penicillins     No current outpatient prescriptions on file prior to visit.   No current facility-administered medications on file prior to visit.    BP  118/68 mmHg  Pulse 88  Temp(Src) 98 F (36.7 C) (Oral)  Ht 5\' 2"  (1.575 m)  Wt 143 lb 12.8 oz (65.227 kg)  BMI 26.29 kg/m2  SpO2 98%  LMP 03/03/2015    Objective:   Physical Exam  Constitutional: She is oriented to person, place, and time. She appears well-nourished.  Cardiovascular: Normal rate and regular rhythm.   Pulmonary/Chest: Effort normal and breath sounds normal.  Neurological: She is alert and oriented to person, place, and time.  Skin: Skin is warm and dry.  Psychiatric: She has a normal mood and affect.          Assessment & Plan:

## 2015-04-06 NOTE — Assessment & Plan Note (Signed)
BMI of 26.2. Currently managed on Phentermine 37.5 [place] She's been on this medication since May 2016 and would like a refill. She's been eating a poor diet recently. Discussed that the goal is to have her off of the phentermine in 3 months and that she will need to work on healthy lifestyle habits now to prevent weight gain when coming off medication. Recommendations provided on AVS. Follow up in 3 months for re-evaluation.

## 2015-04-06 NOTE — Assessment & Plan Note (Signed)
Diagnosed 5 years ago. Currently managed on citalopram 40 mg and Xanax PRN. She uses Xanax sparingly. Feels well managed overall. Continue current regimen.

## 2015-04-06 NOTE — Patient Instructions (Signed)
It is important that you improve your diet. Please limit carbohydrates in the form of white bread, rice, pasta, fast food, candy, sugary drinks, etc. Increase your consumption of fresh fruits and vegetables, lean (grilled and baked) meats, yogurt.  You need to consume about 2 liters of water daily.  Start exercising. You should be getting 1 hour of moderate intensity exercise 5 days weekly.  Please schedule a follow up appointment in 3 months for weight loss efforts.  It was a pleasure to meet you today! Please don't hesitate to call me with any questions. Welcome to [facility]!

## 2015-06-28 ENCOUNTER — Ambulatory Visit (INDEPENDENT_AMBULATORY_CARE_PROVIDER_SITE_OTHER): Payer: Managed Care, Other (non HMO) | Admitting: Family Medicine

## 2015-06-28 ENCOUNTER — Encounter: Payer: Self-pay | Admitting: Family Medicine

## 2015-06-28 VITALS — BP 105/68 | HR 92 | Wt 149.0 lb

## 2015-06-28 DIAGNOSIS — Z3490 Encounter for supervision of normal pregnancy, unspecified, unspecified trimester: Secondary | ICD-10-CM | POA: Insufficient documentation

## 2015-06-28 DIAGNOSIS — Z3481 Encounter for supervision of other normal pregnancy, first trimester: Secondary | ICD-10-CM | POA: Diagnosis not present

## 2015-06-28 DIAGNOSIS — K219 Gastro-esophageal reflux disease without esophagitis: Secondary | ICD-10-CM | POA: Diagnosis not present

## 2015-06-28 DIAGNOSIS — Z3491 Encounter for supervision of normal pregnancy, unspecified, first trimester: Secondary | ICD-10-CM

## 2015-06-28 DIAGNOSIS — Z113 Encounter for screening for infections with a predominantly sexual mode of transmission: Secondary | ICD-10-CM | POA: Diagnosis not present

## 2015-06-28 NOTE — Progress Notes (Signed)
   Subjective:    Elizabeth Hunt is a [id number] [redacted]w[redacted]d being seen today for her first obstetrical visit.  Her obstetrical history is not significant. Patient does intend to breast feed. Pregnancy history fully reviewed.  Patient reports no complaints.  Filed Vitals:   06/28/15 1026  BP: 105/68  Pulse: 92  Weight: 149 lb (67.586 kg)    HISTORY: OB History  Gravida Para Term Preterm AB SAB TAB Ectopic Multiple Living  # Outcome Date GA Lbr Len/2nd Weight Sex Delivery Anes PTL Lv  3 Current           2 Term 05/14/12    Heide Scales  1 Term 06/02/10    F Vag-Spont   Y     Past Medical History  Diagnosis Date  . Chicken pox   . Generalized anxiety disorder   . GERD (gastroesophageal reflux disease)    Past Surgical History  Procedure Laterality Date  . Wisdom tooth extraction     Family History  Problem Relation Age of Onset  . Breast cancer Mother   . Anuerysm Paternal Uncle      Exam     Skin: normal coloration and turgor, no rashes    Neurologic: normal   Extremities: normal strength, tone, and muscle mass   HEENT extra ocular movement intact and sclera clear, anicteric   Mouth/Teeth mucous membranes moist, pharynx normal without lesions   Neck supple   Cardiovascular: regular rate and rhythm   Respiratory:  appears well, vitals normal, no respiratory distress, acyanotic, normal RR, ear and throat exam is normal, neck free of mass or lymphadenopathy, chest clear, no wheezing, crepitations, rhonchi, normal symmetric air entry   Abdomen: soft, non-tender; bowel sounds normal; no masses,  no organomegaly   TVUS reveals SIUP CRL 7 wk 1 d   Assessment/Plan:  1. Supervision of normal pregnancy in first trimester Labs drawn First screen Normal pap (prefers annual in 2016) - Amniotic fluid index - Prenatal Profile - Culture, OB Urine - Urine cytology ancillary only   Shalaine Payson S 06/28/2015

## 2015-06-28 NOTE — Progress Notes (Signed)
Bedside ultrasound today measures [redacted]w[redacted]d fetus with heartbeat.

## 2015-06-28 NOTE — Patient Instructions (Signed)
 First Trimester of Pregnancy The first trimester of pregnancy is from week 1 until the end of week 12 (months 1 through 3). A week after a sperm fertilizes an egg, the egg will implant on the wall of the uterus. This embryo will begin to develop into a baby. Genes from you and your partner are forming the baby. The female genes determine whether the baby is a boy or a girl. At 6-8 weeks, the eyes and face are formed, and the heartbeat can be seen on ultrasound. At the end of 12 weeks, all the baby's organs are formed.  Now that you are pregnant, you will want to do everything you can to have a healthy baby. Two of the most important things are to get good prenatal care and to follow your health care provider's instructions. Prenatal care is all the medical care you receive before the baby's birth. This care will help prevent, find, and treat any problems during the pregnancy and childbirth. BODY CHANGES Your body goes through many changes during pregnancy. The changes vary from woman to woman.   You may gain or lose a couple of pounds at first.  You may feel sick to your stomach (nauseous) and throw up (vomit). If the vomiting is uncontrollable, call your health care provider.  You may tire easily.  You may develop headaches that can be relieved by medicines approved by your health care provider.  You may urinate more often. Painful urination may mean you have a bladder infection.  You may develop heartburn as a result of your pregnancy.  You may develop constipation because certain hormones are causing the muscles that push waste through your intestines to slow down.  You may develop hemorrhoids or swollen, bulging veins (varicose veins).  Your breasts may begin to grow larger and become tender. Your nipples may stick out more, and the tissue that surrounds them (areola) may become darker.  Your gums may bleed and may be sensitive to brushing and flossing.  Dark spots or blotches  (chloasma, mask of pregnancy) may develop on your face. This will likely fade after the baby is born.  Your menstrual periods will stop.  You may have a loss of appetite.  You may develop cravings for certain kinds of food.  You may have changes in your emotions from day to day, such as being excited to be pregnant or being concerned that something may go wrong with the pregnancy and baby.  You may have more vivid and strange dreams.  You may have changes in your hair. These can include thickening of your hair, rapid growth, and changes in texture. Some women also have hair loss during or after pregnancy, or hair that feels dry or thin. Your hair will most likely return to normal after your baby is born. WHAT TO EXPECT AT YOUR PRENATAL VISITS During a routine prenatal visit:  You will be weighed to make sure you and the baby are growing normally.  Your blood pressure will be taken.  Your abdomen will be measured to track your baby's growth.  The fetal heartbeat will be listened to starting around week 10 or 12 of your pregnancy.  Test results from any previous visits will be discussed. Your health care provider may ask you:  How you are feeling.  If you are feeling the baby move.  If you have had any abnormal symptoms, such as leaking fluid, bleeding, severe headaches, or abdominal cramping.  If you are using any tobacco   products, including cigarettes, chewing tobacco, and electronic cigarettes.  If you have any questions. Other tests that may be performed during your first trimester include:  Blood tests to find your blood type and to check for the presence of any previous infections. They will also be used to check for low iron levels (anemia) and Rh antibodies. Later in the pregnancy, blood tests for diabetes will be done along with other tests if problems develop.  Urine tests to check for infections, diabetes, or protein in the urine.  An ultrasound to confirm the  proper growth and development of the baby.  An amniocentesis to check for possible genetic problems.  Fetal screens for spina bifida and Down syndrome.  You may need other tests to make sure you and the baby are doing well.  HIV (human immunodeficiency virus) testing. Routine prenatal testing includes screening for HIV, unless you choose not to have this test. HOME CARE INSTRUCTIONS  Medicines  Follow your health care provider's instructions regarding medicine use. Specific medicines may be either safe or unsafe to take during pregnancy.  Take your prenatal vitamins as directed.  If you develop constipation, try taking a stool softener if your health care provider approves. Diet  Eat regular, well-balanced meals. Choose a variety of foods, such as meat or vegetable-based protein, fish, milk and low-fat dairy products, vegetables, fruits, and whole grain breads and cereals. Your health care provider will help you determine the amount of weight gain that is right for you.  Avoid raw meat and uncooked cheese. These carry germs that can cause birth defects in the baby.  Eating four or five small meals rather than three large meals a day may help relieve nausea and vomiting. If you start to feel nauseous, eating a few soda crackers can be helpful. Drinking liquids between meals instead of during meals also seems to help nausea and vomiting.  If you develop constipation, eat more high-fiber foods, such as fresh vegetables or fruit and whole grains. Drink enough fluids to keep your urine clear or pale yellow. Activity and Exercise  Exercise only as directed by your health care provider. Exercising will help you:  Control your weight.  Stay in shape.  Be prepared for labor and delivery.  Experiencing pain or cramping in the lower abdomen or low back is a good sign that you should stop exercising. Check with your health care provider before continuing normal exercises.  Try to avoid  standing for long periods of time. Move your legs often if you must stand in one place for a long time.  Avoid heavy lifting.  Wear low-heeled shoes, and practice good posture.  You may continue to have sex unless your health care provider directs you otherwise. Relief of Pain or Discomfort  Wear a good support bra for breast tenderness.   Take warm sitz baths to soothe any pain or discomfort caused by hemorrhoids. Use hemorrhoid cream if your health care provider approves.   Rest with your legs elevated if you have leg cramps or low back pain.  If you develop varicose veins in your legs, wear support hose. Elevate your feet for 15 minutes, 3-4 times a day. Limit salt in your diet. Prenatal Care  Schedule your prenatal visits by the twelfth week of pregnancy. They are usually scheduled monthly at first, then more often in the last 2 months before delivery.  Write down your questions. Take them to your prenatal visits.  Keep all your prenatal visits as directed by   your health care provider. Safety  Wear your seat belt at all times when driving.  Make a list of emergency phone numbers, including numbers for family, friends, the hospital, and police and fire departments. General Tips  Ask your health care provider for a referral to a local prenatal education class. Begin classes no later than at the beginning of month 6 of your pregnancy.  Ask for help if you have counseling or nutritional needs during pregnancy. Your health care provider can offer advice or refer you to specialists for help with various needs.  Do not use hot tubs, steam rooms, or saunas.  Do not douche or use tampons or scented sanitary pads.  Do not cross your legs for long periods of time.  Avoid cat litter boxes and soil used by cats. These carry germs that can cause birth defects in the baby and possibly loss of the fetus by miscarriage or stillbirth.  Avoid all smoking, herbs, alcohol, and medicines  not prescribed by your health care provider. Chemicals in these affect the formation and growth of the baby.  Do not use any tobacco products, including cigarettes, chewing tobacco, and electronic cigarettes. If you need help quitting, ask your health care provider. You may receive counseling support and other resources to help you quit.  Schedule a dentist appointment. At home, brush your teeth with a soft toothbrush and be gentle when you floss. SEEK MEDICAL CARE IF:   You have dizziness.  You have mild pelvic cramps, pelvic pressure, or nagging pain in the abdominal area.  You have persistent nausea, vomiting, or diarrhea.  You have a bad smelling vaginal discharge.  You have pain with urination.  You notice increased swelling in your face, hands, legs, or ankles. SEEK IMMEDIATE MEDICAL CARE IF:   You have a fever.  You are leaking fluid from your vagina.  You have spotting or bleeding from your vagina.  You have severe abdominal cramping or pain.  You have rapid weight gain or loss.  You vomit blood or material that looks like coffee grounds.  You are exposed to German measles and have never had them.  You are exposed to fifth disease or chickenpox.  You develop a severe headache.  You have shortness of breath.  You have any kind of trauma, such as from a fall or a car accident.   This information is not intended to replace advice given to you by your health care provider. Make sure you discuss any questions you have with your health care provider.   Document Released: 05/08/2001 Document Revised: 06/04/2014 Document Reviewed: 03/24/2013 [employer] Interactive Patient Education 2016 [employer]   Breastfeeding Deciding to breastfeed is one of the best choices you can make for you and your baby. A change in hormones during pregnancy causes your breast tissue to grow and increases the number and size of your milk ducts. These hormones also allow proteins, sugars,  and fats from your blood supply to make breast milk in your milk-producing glands. Hormones prevent breast milk from being released before your baby is born as well as prompt milk flow after birth. Once breastfeeding has begun, thoughts of your baby, as well as his or her sucking or crying, can stimulate the release of milk from your milk-producing glands.  BENEFITS OF BREASTFEEDING For Your Baby  Your first milk (colostrum) helps your baby's digestive system function better.  There are antibodies in your milk that help your baby fight off infections.  Your baby has   a lower incidence of asthma, allergies, and sudden infant death syndrome.  The nutrients in breast milk are better for your baby than infant formulas and are designed uniquely for your baby's needs.  Breast milk improves your baby's brain development.  Your baby is less likely to develop other conditions, such as childhood obesity, asthma, or type 2 diabetes mellitus. For You  Breastfeeding helps to create a very special bond between you and your baby.  Breastfeeding is convenient. Breast milk is always available at the correct temperature and costs nothing.  Breastfeeding helps to burn calories and helps you lose the weight gained during pregnancy.  Breastfeeding makes your uterus contract to its prepregnancy size faster and slows bleeding (lochia) after you give birth.   Breastfeeding helps to lower your risk of developing type 2 diabetes mellitus, osteoporosis, and breast or ovarian cancer later in life. SIGNS THAT YOUR BABY IS HUNGRY Early Signs of Hunger  Increased alertness or activity.  Stretching.  Movement of the head from side to side.  Movement of the head and opening of the mouth when the corner of the mouth or cheek is stroked (rooting).  Increased sucking sounds, smacking lips, cooing, sighing, or squeaking.  Hand-to-mouth movements.  Increased sucking of fingers or hands. Late Signs of  Hunger  Fussing.  Intermittent crying. Extreme Signs of Hunger Signs of extreme hunger will require calming and consoling before your baby will be able to breastfeed successfully. Do not wait for the following signs of extreme hunger to occur before you initiate breastfeeding:  Restlessness.  A loud, strong cry.  Screaming. BREASTFEEDING BASICS Breastfeeding Initiation  Find a comfortable place to sit or lie down, with your neck and back well supported.  Place a pillow or rolled up blanket under your baby to bring him or her to the level of your breast (if you are seated). Nursing pillows are specially designed to help support your arms and your baby while you breastfeed.  Make sure that your baby's abdomen is facing your abdomen.  Gently massage your breast. With your fingertips, massage from your chest wall toward your nipple in a circular motion. This encourages milk flow. You may need to continue this action during the feeding if your milk flows slowly.  Support your breast with 4 fingers underneath and your thumb above your nipple. Make sure your fingers are well away from your nipple and your baby's mouth.  Stroke your baby's lips gently with your finger or nipple.  When your baby's mouth is open wide enough, quickly bring your baby to your breast, placing your entire nipple and as much of the colored area around your nipple (areola) as possible into your baby's mouth.  More areola should be visible above your baby's upper lip than below the lower lip.  Your baby's tongue should be between his or her lower gum and your breast.  Ensure that your baby's mouth is correctly positioned around your nipple (latched). Your baby's lips should create a seal on your breast and be turned out (everted).  It is common for your baby to suck about 2-3 minutes in order to start the flow of breast milk. Latching Teaching your baby how to latch on to your breast properly is very important.  An improper latch can cause nipple pain and decreased milk supply for you and poor weight gain in your baby. Also, if your baby is not latched onto your nipple properly, he or she may swallow some air during feeding. This   can make your baby fussy. Burping your baby when you switch breasts during the feeding can help to get rid of the air. However, teaching your baby to latch on properly is still the best way to prevent fussiness from swallowing air while breastfeeding. Signs that your baby has successfully latched on to your nipple:  Silent tugging or silent sucking, without causing you pain.  Swallowing heard between every 3-4 sucks.  Muscle movement above and in front of his or her ears while sucking. Signs that your baby has not successfully latched on to nipple:  Sucking sounds or smacking sounds from your baby while breastfeeding.  Nipple pain. If you think your baby has not latched on correctly, slip your finger into the corner of your baby's mouth to break the suction and place it between your baby's gums. Attempt breastfeeding initiation again. Signs of Successful Breastfeeding Signs from your baby:  A gradual decrease in the number of sucks or complete cessation of sucking.  Falling asleep.  Relaxation of his or her body.  Retention of a small amount of milk in his or her mouth.  Letting go of your breast by himself or herself. Signs from you:  Breasts that have increased in firmness, weight, and size 1-3 hours after feeding.  Breasts that are softer immediately after breastfeeding.  Increased milk volume, as well as a change in milk consistency and color by the fifth day of breastfeeding.  Nipples that are not sore, cracked, or bleeding. Signs That Your Baby is Getting Enough Milk  Wetting at least 3 diapers in a 24-hour period. The urine should be clear and pale yellow by age 5 days.  At least 3 stools in a 24-hour period by age 5 days. The stool should be soft and  yellow.  At least 3 stools in a 24-hour period by age 7 days. The stool should be seedy and yellow.  No loss of weight greater than 10% of birth weight during the first 3 days of age.  Average weight gain of 4-7 ounces (113-198 g) per week after age 4 days.  Consistent daily weight gain by age 5 days, without weight loss after the age of 2 weeks. After a feeding, your baby may spit up a small amount. This is common. BREASTFEEDING FREQUENCY AND DURATION Frequent feeding will help you make more milk and can prevent sore nipples and breast engorgement. Breastfeed when you feel the need to reduce the fullness of your breasts or when your baby shows signs of hunger. This is called "breastfeeding on demand." Avoid introducing a pacifier to your baby while you are working to establish breastfeeding (the first 4-6 weeks after your baby is born). After this time you may choose to use a pacifier. Research has shown that pacifier use during the first year of a baby's life decreases the risk of sudden infant death syndrome (SIDS). Allow your baby to feed on each breast as long as he or she wants. Breastfeed until your baby is finished feeding. When your baby unlatches or falls asleep while feeding from the first breast, offer the second breast. Because newborns are often sleepy in the first few weeks of life, you may need to awaken your baby to get him or her to feed. Breastfeeding times will vary from baby to baby. However, the following rules can serve as a guide to help you ensure that your baby is properly fed:  Newborns (babies 4 weeks of age or younger) may breastfeed every 1-3 hours.    Newborns should not go longer than 3 hours during the day or 5 hours during the night without breastfeeding.  You should breastfeed your baby a minimum of 8 times in a 24-hour period until you begin to introduce solid foods to your baby at around 6 months of age. BREAST MILK PUMPING Pumping and storing breast milk  allows you to ensure that your baby is exclusively fed your breast milk, even at times when you are unable to breastfeed. This is especially important if you are going back to work while you are still breastfeeding or when you are not able to be present during feedings. Your lactation consultant can give you guidelines on how long it is safe to store breast milk. A breast pump is a machine that allows you to pump milk from your breast into a sterile bottle. The pumped breast milk can then be stored in a refrigerator or freezer. Some breast pumps are operated by hand, while others use electricity. Ask your lactation consultant which type will work best for you. Breast pumps can be purchased, but some hospitals and breastfeeding support groups lease breast pumps on a monthly basis. A lactation consultant can teach you how to hand express breast milk, if you prefer not to use a pump. CARING FOR YOUR BREASTS WHILE YOU BREASTFEED Nipples can become dry, cracked, and sore while breastfeeding. The following recommendations can help keep your breasts moisturized and healthy:  Avoid using soap on your nipples.  Wear a supportive bra. Although not required, special nursing bras and tank tops are designed to allow access to your breasts for breastfeeding without taking off your entire bra or top. Avoid wearing underwire-style bras or extremely tight bras.  Air dry your nipples for 3-4minutes after each feeding.  Use only cotton bra pads to absorb leaked breast milk. Leaking of breast milk between feedings is normal.  Use lanolin on your nipples after breastfeeding. Lanolin helps to maintain your skin's normal moisture barrier. If you use pure lanolin, you do not need to wash it off before feeding your baby again. Pure lanolin is not toxic to your baby. You may also hand express a few drops of breast milk and gently massage that milk into your nipples and allow the milk to air dry. In the first few weeks after  giving birth, some women experience extremely full breasts (engorgement). Engorgement can make your breasts feel heavy, warm, and tender to the touch. Engorgement peaks within 3-5 days after you give birth. The following recommendations can help ease engorgement:  Completely empty your breasts while breastfeeding or pumping. You may want to start by applying warm, moist heat (in the shower or with warm water-soaked hand towels) just before feeding or pumping. This increases circulation and helps the milk flow. If your baby does not completely empty your breasts while breastfeeding, pump any extra milk after he or she is finished.  Wear a snug bra (nursing or regular) or tank top for 1-2 days to signal your body to slightly decrease milk production.  Apply ice packs to your breasts, unless this is too uncomfortable for you.  Make sure that your baby is latched on and positioned properly while breastfeeding. If engorgement persists after 48 hours of following these recommendations, contact your health care provider or a lactation consultant. OVERALL HEALTH CARE RECOMMENDATIONS WHILE BREASTFEEDING  Eat healthy foods. Alternate between meals and snacks, eating 3 of each per day. Because what you eat affects your breast milk, some of the foods   may make your baby more irritable than usual. Avoid eating these foods if you are sure that they are negatively affecting your baby.  Drink milk, fruit juice, and water to satisfy your thirst (about 10 glasses a day).  Rest often, relax, and continue to take your prenatal vitamins to prevent fatigue, stress, and anemia.  Continue breast self-awareness checks.  Avoid chewing and smoking tobacco. Chemicals from cigarettes that pass into breast milk and exposure to secondhand smoke may harm your baby.  Avoid alcohol and drug use, including marijuana. Some medicines that may be harmful to your baby can pass through breast milk. It is important to ask your health  care provider before taking any medicine, including all over-the-counter and prescription medicine as well as vitamin and herbal supplements. It is possible to become pregnant while breastfeeding. If birth control is desired, ask your health care provider about options that will be safe for your baby. SEEK MEDICAL CARE IF:  You feel like you want to stop breastfeeding or have become frustrated with breastfeeding.  You have painful breasts or nipples.  Your nipples are cracked or bleeding.  Your breasts are red, tender, or warm.  You have a swollen area on either breast.  You have a fever or chills.  You have nausea or vomiting.  You have drainage other than breast milk from your nipples.  Your breasts do not become full before feedings by the fifth day after you give birth.  You feel sad and depressed.  Your baby is too sleepy to eat well.  Your baby is having trouble sleeping.   Your baby is wetting less than 3 diapers in a 24-hour period.  Your baby has less than 3 stools in a 24-hour period.  Your baby's skin or the white part of his or her eyes becomes yellow.   Your baby is not gaining weight by 5 days of age. SEEK IMMEDIATE MEDICAL CARE IF:  Your baby is overly tired (lethargic) and does not want to wake up and feed.  Your baby develops an unexplained fever.   This information is not intended to replace advice given to you by your health care provider. Make sure you discuss any questions you have with your health care provider.   Document Released: 05/14/2005 Document Revised: 02/02/2015 Document Reviewed: 11/05/2012 [employer] Interactive Patient Education 2016 [employer]  

## 2015-06-29 LAB — URINE CYTOLOGY ANCILLARY ONLY
CHLAMYDIA, DNA PROBE: NEGATIVE
NEISSERIA GONORRHEA: NEGATIVE

## 2015-06-30 LAB — PRENATAL PROFILE (SOLSTAS)
ANTIBODY SCREEN: NEGATIVE
Basophils Absolute: 0 10*3/uL (ref 0.0–0.1)
Basophils Relative: 0 % (ref 0–1)
EOS ABS: 0.1 10*3/uL (ref 0.0–0.7)
EOS PCT: 1 % (ref 0–5)
HCT: 33.7 % — ABNORMAL LOW (ref 36.0–46.0)
HIV: NONREACTIVE
Hemoglobin: 11 g/dL — ABNORMAL LOW (ref 12.0–15.0)
Hepatitis B Surface Ag: NEGATIVE
LYMPHS ABS: 2.4 10*3/uL (ref 0.7–4.0)
LYMPHS PCT: 36 % (ref 12–46)
MCH: 29.7 pg (ref 26.0–34.0)
MCHC: 32.6 g/dL (ref 30.0–36.0)
MCV: 91.1 fL (ref 78.0–100.0)
MONO ABS: 0.5 10*3/uL (ref 0.1–1.0)
MONOS PCT: 8 % (ref 3–12)
MPV: 11 fL (ref 8.6–12.4)
Neutro Abs: 3.7 10*3/uL (ref 1.7–7.7)
Neutrophils Relative %: 55 % (ref 43–77)
PLATELETS: 235 10*3/uL (ref 150–400)
RBC: 3.7 MIL/uL — ABNORMAL LOW (ref 3.87–5.11)
RDW: 13.1 % (ref 11.5–15.5)
RH TYPE: POSITIVE
RUBELLA: 5 {index} — AB (ref <0.90)
WBC: 6.7 10*3/uL (ref 4.0–10.5)

## 2015-06-30 LAB — CULTURE, OB URINE: Colony Count: >=100000

## 2015-07-06 ENCOUNTER — Ambulatory Visit: Payer: Managed Care, Other (non HMO) | Admitting: Primary Care

## 2015-07-26 ENCOUNTER — Encounter (HOSPITAL_COMMUNITY): Payer: Self-pay | Admitting: Family Medicine

## 2015-07-26 ENCOUNTER — Ambulatory Visit (INDEPENDENT_AMBULATORY_CARE_PROVIDER_SITE_OTHER): Payer: Managed Care, Other (non HMO) | Admitting: Obstetrics & Gynecology

## 2015-07-26 VITALS — BP 105/68 | HR 88 | Wt 150.0 lb

## 2015-07-26 DIAGNOSIS — N760 Acute vaginitis: Secondary | ICD-10-CM

## 2015-07-26 DIAGNOSIS — Z3481 Encounter for supervision of other normal pregnancy, first trimester: Secondary | ICD-10-CM

## 2015-07-26 DIAGNOSIS — O23591 Infection of other part of genital tract in pregnancy, first trimester: Secondary | ICD-10-CM

## 2015-07-26 NOTE — Patient Instructions (Addendum)
Thank you for enrolling in MyChart. Please follow the instructions below to securely access your online medical record. MyChart allows you to send messages to your doctor, view your test results, manage appointments, and more.   How Do I Sign Up? 1. In your Internet browser, go to [employer] and enter [email or web address]. 2. Click on the Sign Up Now link in the Sign In box. You will see the New Member Sign Up page. 3. Enter your MyChart Access Code exactly as it appears below. You will not need to use this code after you've completed the sign-up process. If you do not sign up before the expiration date, you must request a new code.  MyChart Access Code: [email or web address] Expires: 08/13/2015 10:06 AM  4. Enter your Social Security Number ([email or web address]) and Date of Birth (mm/dd/yyyy) as indicated and click Submit. You will be taken to the next sign-up page. 5. Create a MyChart ID. This will be your MyChart login ID and cannot be changed, so think of one that is secure and easy to remember. 6. Create a MyChart password. You can change your password at any time. 7. Enter your Password Reset Question and Answer. This can be used at a later time if you forget your password.  8. Enter your e-mail address. You will receive e-mail notification when new information is available in MyChart. 9. Click Sign Up. You can now view your medical record.   Additional Information Remember, MyChart is NOT to be used for urgent needs. For medical emergencies, dial 911.     Second Trimester of Pregnancy The second trimester is from week 13 through week 28, months 4 through 6. The second trimester is often a time when you feel your best. Your body has also adjusted to being pregnant, and you begin to feel better physically. Usually, morning sickness has lessened or quit completely, you may have more energy, and you may have an increase in appetite. The second trimester is also a time when the fetus is  growing rapidly. At the end of the sixth month, the fetus is about 9 inches long and weighs about 1 pounds. You will likely begin to feel the baby move (quickening) between 18 and 20 weeks of the pregnancy. BODY CHANGES Your body goes through many changes during pregnancy. The changes vary from woman to woman.   Your weight will continue to increase. You will notice your lower abdomen bulging out.  You may begin to get stretch marks on your hips, abdomen, and breasts.  You may develop headaches that can be relieved by medicines approved by your health care provider.  You may urinate more often because the fetus is pressing on your bladder.  You may develop or continue to have heartburn as a result of your pregnancy.  You may develop constipation because certain hormones are causing the muscles that push waste through your intestines to slow down.  You may develop hemorrhoids or swollen, bulging veins (varicose veins).  You may have back pain because of the weight gain and pregnancy hormones relaxing your joints between the bones in your pelvis and as a result of a shift in weight and the muscles that support your balance.  Your breasts will continue to grow and be tender.  Your gums may bleed and may be sensitive to brushing and flossing.  Dark spots or blotches (chloasma, mask of pregnancy) may develop on your face. This will likely fade after the baby is born.  A dark line from your  belly button to the pubic area (linea nigra) may appear. This will likely fade after the baby is born.  You may have changes in your hair. These can include thickening of your hair, rapid growth, and changes in texture. Some women also have hair loss during or after pregnancy, or hair that feels dry or thin. Your hair will most likely return to normal after your baby is born. WHAT TO EXPECT AT YOUR PRENATAL VISITS During a routine prenatal visit:  You will be weighed to make sure you and the fetus are  growing normally.  Your blood pressure will be taken.  Your abdomen will be measured to track your baby's growth.  The fetal heartbeat will be listened to.  Any test results from the previous visit will be discussed. Your health care provider may ask you:  How you are feeling.  If you are feeling the baby move.  If you have had any abnormal symptoms, such as leaking fluid, bleeding, severe headaches, or abdominal cramping.  If you are using any tobacco products, including cigarettes, chewing tobacco, and electronic cigarettes.  If you have any questions. Other tests that may be performed during your second trimester include:  Blood tests that check for:  Low iron levels (anemia).  Gestational diabetes (between 24 and 28 weeks).  Rh antibodies.  Urine tests to check for infections, diabetes, or protein in the urine.  An ultrasound to confirm the proper growth and development of the baby.  An amniocentesis to check for possible genetic problems.  Fetal screens for spina bifida and Down syndrome.  HIV (human immunodeficiency virus) testing. Routine prenatal testing includes screening for HIV, unless you choose not to have this test. HOME CARE INSTRUCTIONS   Avoid all smoking, herbs, alcohol, and unprescribed drugs. These chemicals affect the formation and growth of the baby.  Do not use any tobacco products, including cigarettes, chewing tobacco, and electronic cigarettes. If you need help quitting, ask your health care provider. You may receive counseling support and other resources to help you quit.  Follow your health care provider's instructions regarding medicine use. There are medicines that are either safe or unsafe to take during pregnancy.  Exercise only as directed by your health care provider. Experiencing uterine cramps is a good sign to stop exercising.  Continue to eat regular, healthy meals.  Wear a good support bra for breast tenderness.  Do not use  hot tubs, steam rooms, or saunas.  Wear your seat belt at all times when driving.  Avoid raw meat, uncooked cheese, cat litter boxes, and soil used by cats. These carry germs that can cause birth defects in the baby.  Take your prenatal vitamins.  Take 1500-2000 mg of calcium daily starting at the 20th week of pregnancy until you deliver your baby.  Try taking a stool softener (if your health care provider approves) if you develop constipation. Eat more high-fiber foods, such as fresh vegetables or fruit and whole grains. Drink plenty of fluids to keep your urine clear or pale yellow.  Take warm sitz baths to soothe any pain or discomfort caused by hemorrhoids. Use hemorrhoid cream if your health care provider approves.  If you develop varicose veins, wear support hose. Elevate your feet for 15 minutes, 3-4 times a day. Limit salt in your diet.  Avoid heavy lifting, wear low heel shoes, and practice good posture.  Rest with your legs elevated if you have leg cramps or low back pain.  Visit your dentist if  you have not gone yet during your pregnancy. Use a soft toothbrush to brush your teeth and be gentle when you floss.  A sexual relationship may be continued unless your health care provider directs you otherwise.  Continue to go to all your prenatal visits as directed by your health care provider. SEEK MEDICAL CARE IF:   You have dizziness.  You have mild pelvic cramps, pelvic pressure, or nagging pain in the abdominal area.  You have persistent nausea, vomiting, or diarrhea.  You have a bad smelling vaginal discharge.  You have pain with urination. SEEK IMMEDIATE MEDICAL CARE IF:   You have a fever.  You are leaking fluid from your vagina.  You have spotting or bleeding from your vagina.  You have severe abdominal cramping or pain.  You have rapid weight gain or loss.  You have shortness of breath with chest pain.  You notice sudden or extreme swelling of your  face, hands, ankles, feet, or legs.  You have not felt your baby move in over an hour.  You have severe headaches that do not go away with medicine.  You have vision changes.   This information is not intended to replace advice given to you by your health care provider. Make sure you discuss any questions you have with your health care provider.   Document Released: 05/08/2001 Document Revised: 06/04/2014 Document Reviewed: 07/15/2012 Elsevier Interactive Patient Education [employer]2016 Elsevier Inc.

## 2015-07-26 NOTE — Progress Notes (Signed)
Subjective:  Elizabeth Hunt is a 30 y.o. G3P2002 at [redacted]w[redacted]d being seen today for ongoing prenatal care.  She is currently monitored for the following issues for this low-risk pregnancy and has Overweight (BMI 25.0-29.9); GAD (generalized anxiety disorder); GERD (gastroesophageal reflux disease); and Supervision of normal pregnancy, antepartum on her problem list.  Patient reports white vaginal discharge with irritation, believes she could have a yeast infection.   Contractions: Not present. Vag. Bleeding: None.  Movement: Absent. Denies leaking of fluid.   The following portions of the patient's history were reviewed and updated as appropriate: allergies, current medications, past family history, past medical history, past social history, past surgical history and problem list. Problem list updated.  Objective:   Filed Vitals:   07/26/15 1034  BP: 105/68  Pulse: 88  Weight: 150 lb (68.04 kg)    Fetal Status: Fetal Heart Rate (bpm): 176   Movement: Absent     General:  Alert, oriented and cooperative. Patient is in no acute distress.  Skin: Skin is warm and dry. No rash noted.   Cardiovascular: Normal heart rate noted  Respiratory: Normal respiratory effort, no problems with respiration noted  Abdomen: Soft, gravid, appropriate for gestational age. Pain/Pressure: Absent     Pelvic: Vag. Bleeding: None Vag D/C Character: White  Sample obtained for wet prep. Cervical exam deferred        Extremities: Normal range of motion.  Edema: None  Mental Status: Normal mood and affect. Normal behavior. Normal judgment and thought content.   Urinalysis: Urine Protein: Negative Urine Glucose: Negative  Assessment and Plan:  Pregnancy: G3P2002 at [redacted]w[redacted]d  1. Vaginitis affecting pregnancy in first trimester, antepartum - Wet prep, genital done. will follow up results and manage accordingly. Monistat sample given to patient  2. Supervision of normal pregnancy, antepartum, first trimester No other  complaints or concerns.  First trimester screen scheduled, other prenatal labs reviewed with patient. Routine obstetric precautions reviewed. Please refer to After Visit Summary for other counseling recommendations.  Return in about 4 weeks (around 08/23/2015) for OB Visit.   Tereso Newcomer, MD

## 2015-07-27 LAB — WET PREP, GENITAL
CLUE CELLS WET PREP: NONE SEEN
Trich, Wet Prep: NONE SEEN
YEAST WET PREP: NONE SEEN

## 2015-08-02 ENCOUNTER — Other Ambulatory Visit (HOSPITAL_COMMUNITY): Payer: Managed Care, Other (non HMO)

## 2015-08-02 ENCOUNTER — Ambulatory Visit (HOSPITAL_COMMUNITY): Payer: Managed Care, Other (non HMO)

## 2015-08-03 ENCOUNTER — Ambulatory Visit (HOSPITAL_COMMUNITY)
Admission: RE | Admit: 2015-08-03 | Discharge: 2015-08-03 | Disposition: A | Discharge status: Home / Self Care | Payer: Managed Care, Other (non HMO) | Source: Ambulatory Visit | Attending: Family Medicine | Admitting: Family Medicine

## 2015-08-03 ENCOUNTER — Encounter (HOSPITAL_COMMUNITY): Payer: Self-pay

## 2015-08-03 DIAGNOSIS — Z3491 Encounter for supervision of normal pregnancy, unspecified, first trimester: Secondary | ICD-10-CM

## 2015-08-03 DIAGNOSIS — Z36 Encounter for antenatal screening of mother: Secondary | ICD-10-CM | POA: Insufficient documentation

## 2015-08-03 DIAGNOSIS — Z3A12 12 weeks gestation of pregnancy: Secondary | ICD-10-CM | POA: Diagnosis not present

## 2015-08-03 IMAGING — US US MFM FETAL NUCHAL TRANSLUCENCY
1 series · 15 of 25 positions shown · non-contrast
Comparison: none

[Series 1: us mfm fetal nuchal translucency · 25 acquisitions, 15 frames shown]
[im 1/25]
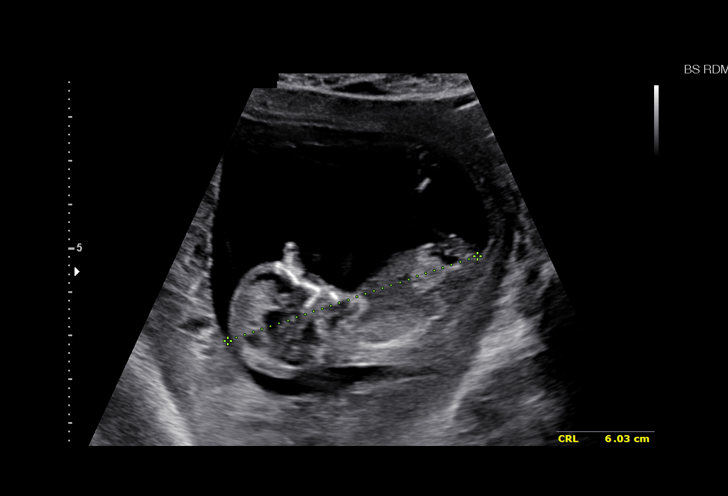
[im 3/25]
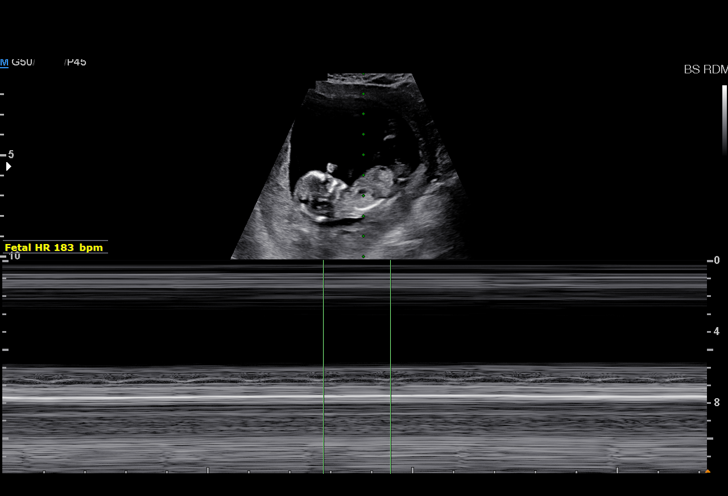
[im 5/25]
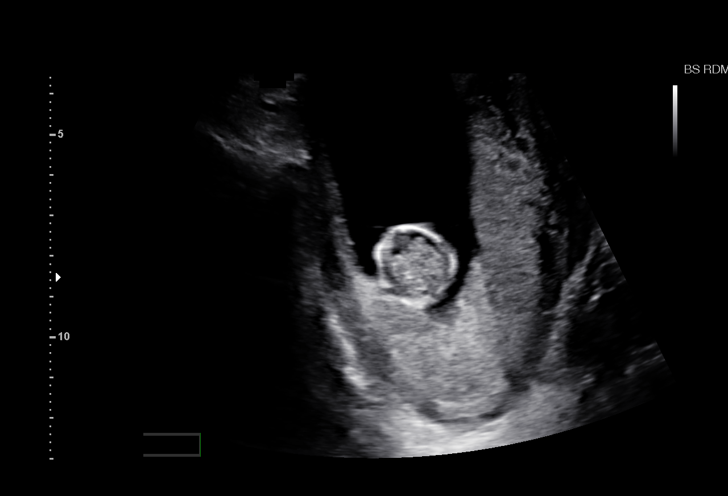
[im 6/25]
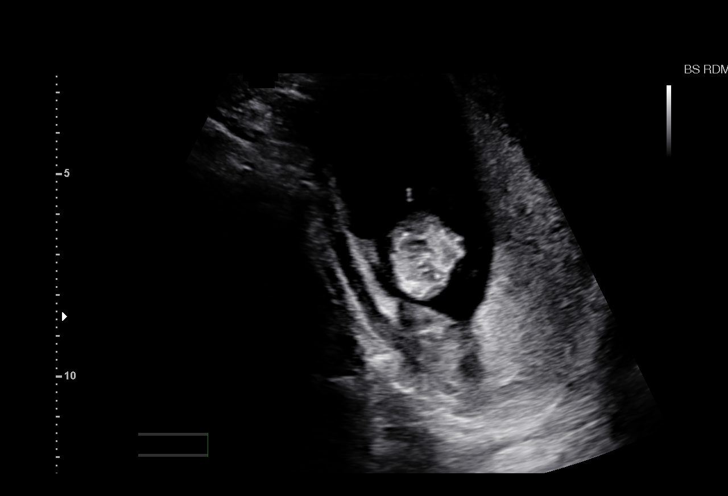
[im 8/25]
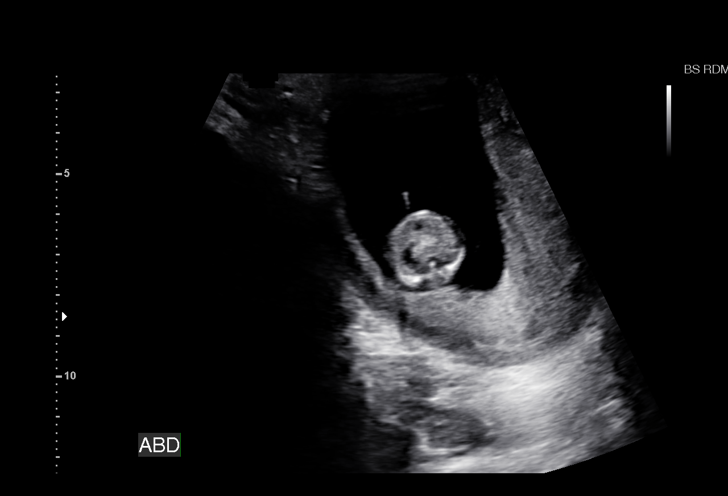
[im 10/25]
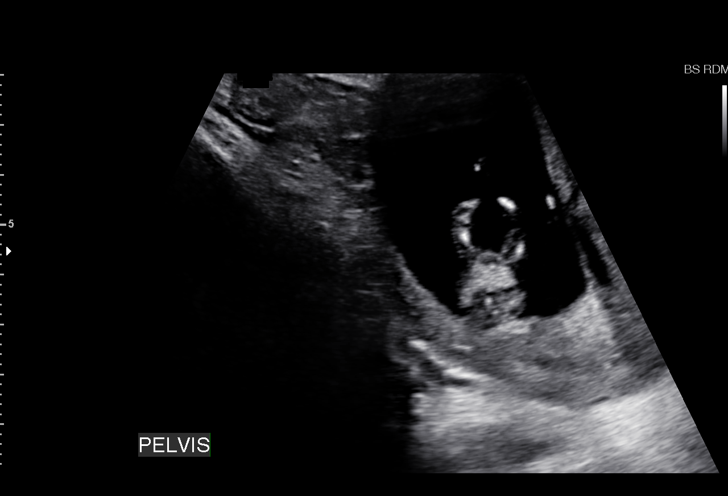
[im 11/25]
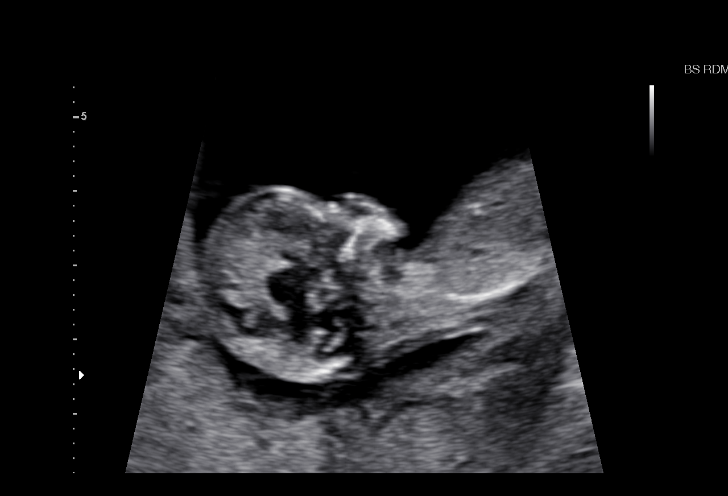
[im 13/25]
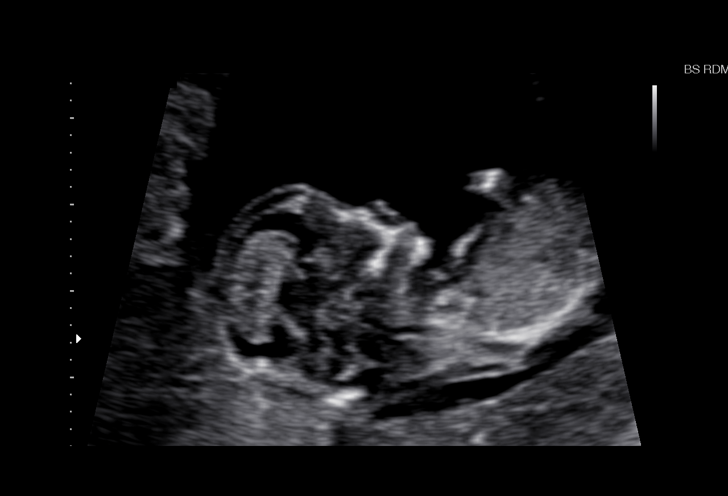
[im 15/25]
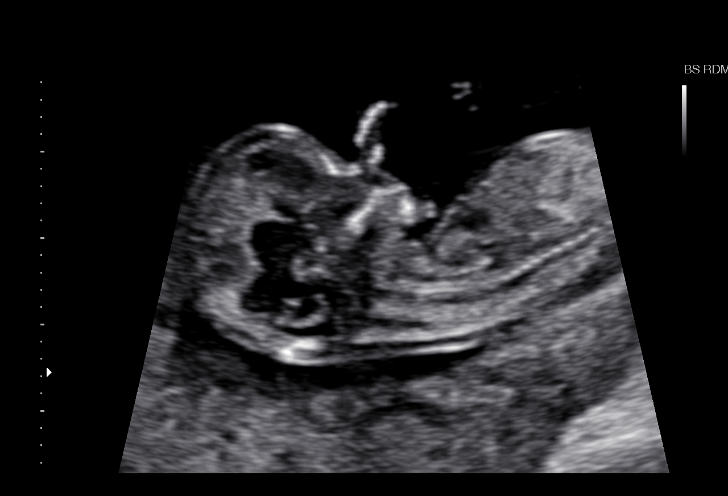
[im 16/25]
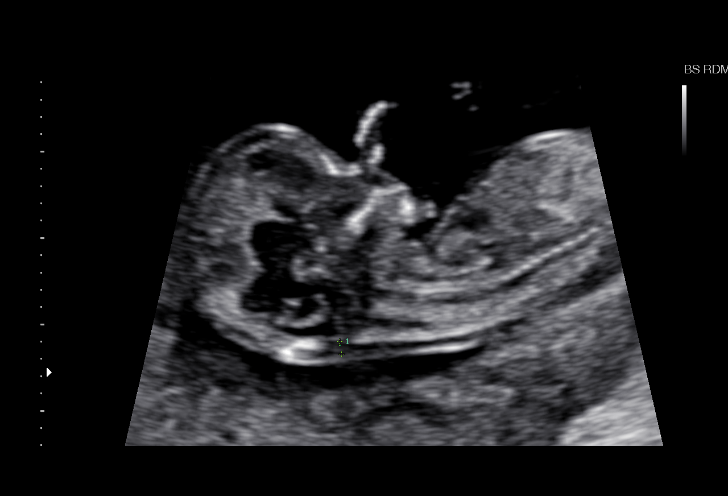
[im 18/25]
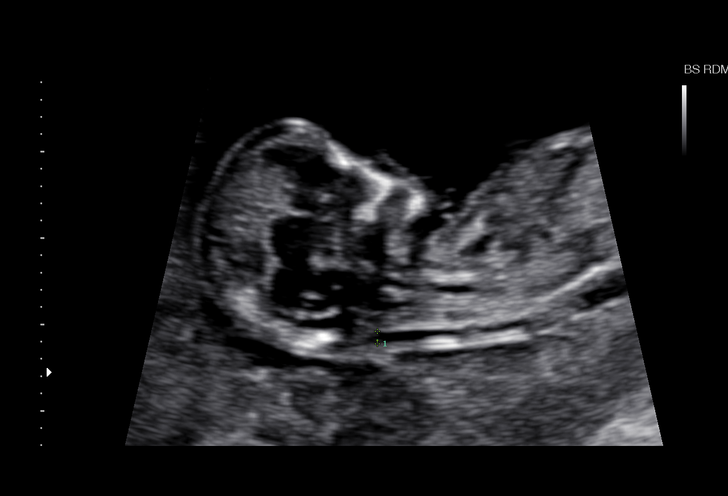
[im 20/25]
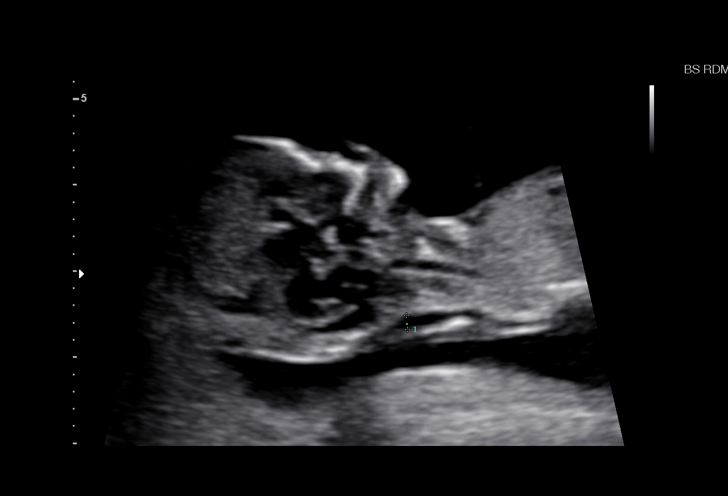
[im 21/25]
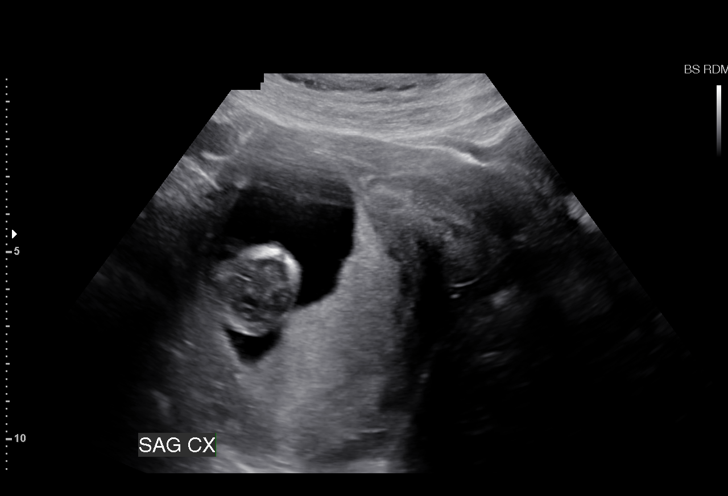
[im 23/25]
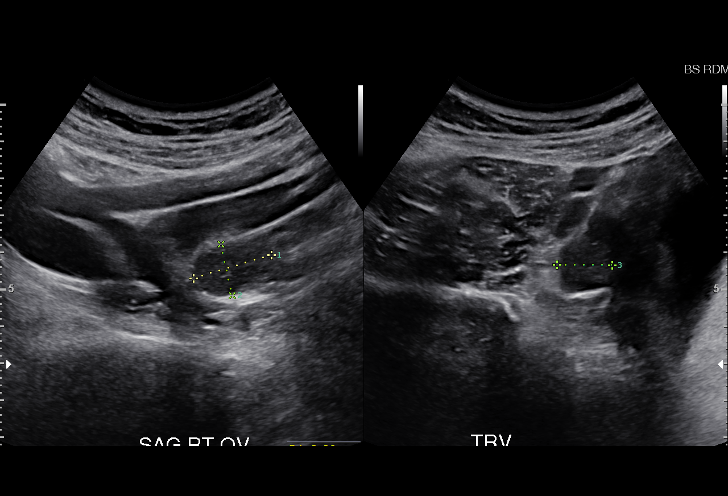
[im 25/25]
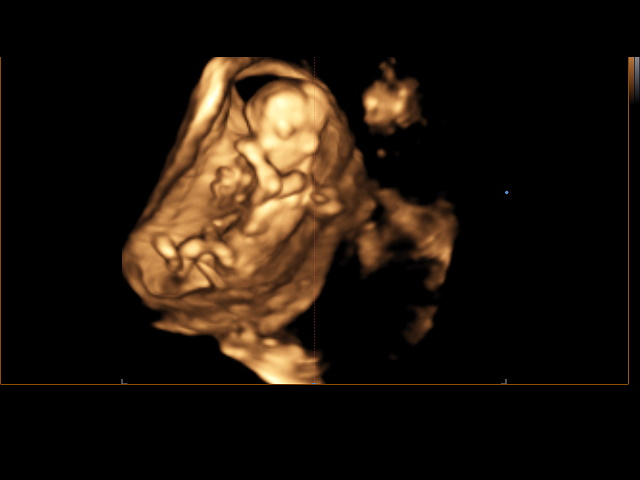

[15 of 25 positions shown; findings below may reference images not displayed]

[REDACTED]

TRANSLUCENCY

1  JONG MIN              [PHONE_NUMBER]      [PHONE_NUMBER]     [PHONE_NUMBER]
Indications

First trimester aneuploidy screen (NT)         Z36
12 weeks gestation of pregnancy
OB History

Gravidity:    3         Term:   2        Prem:   0         SAB:   0
TOP:          0       Ectopic:  0        Living: 2
Fetal Evaluation

Num Of Fetuses:     1
Fetal Heart         183
Rate(bpm):
Cardiac Activity:   Observed
Presentation:       Transverse, head to maternal right
Placenta:           Posterior
P. Cord Insertion:  Not well visualized

Amniotic Fluid
AFI FV:      Subjectively within normal limits
Biometry

CRL:      60.1  mm     G. Age:  12w 2d                   EDD:   [DATE]
Gestational Age
LMP:           12w 1d        Date:  [DATE]                 EDD:    [DATE]
Best:          12w 1d     Det. By:  LMP  ([DATE])          EDD:    [DATE]
1st Trimester Genetic Sonogram Screening

CRL:            60.1  mm    G. Age:   12w 2d                 EDD:    [DATE]
Nuc Trans:       1.5  mm

Nasal Bone:                 Present
Anatomy

Choroid Plexus:   Appears normal         Upper             Present
Extremities:
Stomach:          Appears normal, left   Lower             Present
sided                  Extremities:
Cervix Uterus Adnexa

Cervix
Normal appearance by transabdominal scan.

Uterus
Retroverted.

Left Ovary
Not visualized.

Right Ovary
Within normal limits.

Adnexa:       No abnormality visualized.
Impression

SIUP at 12+1 weeks
No gross abnormalities identified
NT measurement was within normal limits for this GA; NB
present
Normal amniotic fluid volume
Measurements consistent with LMP dating
Recommendations

Offer MSAFP in the second trimester for ONTD screening
Offer anatomy U/S by 18 weeks

## 2015-08-15 ENCOUNTER — Encounter: Payer: Self-pay | Admitting: *Deleted

## 2015-08-19 ENCOUNTER — Other Ambulatory Visit (HOSPITAL_COMMUNITY): Payer: Self-pay

## 2015-08-23 ENCOUNTER — Ambulatory Visit (INDEPENDENT_AMBULATORY_CARE_PROVIDER_SITE_OTHER): Payer: Managed Care, Other (non HMO) | Admitting: Obstetrics & Gynecology

## 2015-08-23 VITALS — BP 108/69 | HR 83 | Wt 151.0 lb

## 2015-08-23 DIAGNOSIS — Z3482 Encounter for supervision of other normal pregnancy, second trimester: Secondary | ICD-10-CM | POA: Diagnosis not present

## 2015-08-23 DIAGNOSIS — Z36 Encounter for antenatal screening of mother: Secondary | ICD-10-CM

## 2015-08-23 DIAGNOSIS — F411 Generalized anxiety disorder: Secondary | ICD-10-CM | POA: Diagnosis not present

## 2015-08-23 DIAGNOSIS — O99342 Other mental disorders complicating pregnancy, second trimester: Secondary | ICD-10-CM | POA: Diagnosis not present

## 2015-08-23 DIAGNOSIS — Z1389 Encounter for screening for other disorder: Secondary | ICD-10-CM

## 2015-08-23 DIAGNOSIS — F419 Anxiety disorder, unspecified: Secondary | ICD-10-CM

## 2015-08-23 MED ORDER — HYDROXYZINE PAMOATE 50 MG PO CAPS: 50.0000 mg | ORAL_CAPSULE | Freq: Three times a day (TID) | ORAL | Status: AC | PRN

## 2015-08-23 NOTE — Patient Instructions (Addendum)
Return to clinic for any scheduled appointments or obstetric concerns, or go to [facility] for evaluation  Second Trimester of Pregnancy The second trimester is from week 13 through week 28, months 4 through 6. The second trimester is often a time when you feel your best. Your body has also adjusted to being pregnant, and you begin to feel better physically. Usually, morning sickness has lessened or quit completely, you may have more energy, and you may have an increase in appetite. The second trimester is also a time when the fetus is growing rapidly. At the end of the sixth month, the fetus is about 9 inches long and weighs about 1 pounds. You will likely begin to feel the baby move (quickening) between 18 and 20 weeks of the pregnancy. BODY CHANGES Your body goes through many changes during pregnancy. The changes vary from woman to woman.   Your weight will continue to increase. You will notice your lower abdomen bulging out.  You may begin to get stretch marks on your hips, abdomen, and breasts.  You may develop headaches that can be relieved by medicines approved by your health care provider.  You may urinate more often because the fetus is pressing on your bladder.  You may develop or continue to have heartburn as a result of your pregnancy.  You may develop constipation because certain hormones are causing the muscles that push waste through your intestines to slow down.  You may develop hemorrhoids or swollen, bulging veins (varicose veins).  You may have back pain because of the weight gain and pregnancy hormones relaxing your joints between the bones in your pelvis and as a result of a shift in weight and the muscles that support your balance.  Your breasts will continue to grow and be tender.  Your gums may bleed and may be sensitive to brushing and flossing.  Dark spots or blotches (chloasma, mask of pregnancy) may develop on your face. This will likely fade after the baby is  born.  A dark line from your belly button to the pubic area (linea nigra) may appear. This will likely fade after the baby is born.  You may have changes in your hair. These can include thickening of your hair, rapid growth, and changes in texture. Some women also have hair loss during or after pregnancy, or hair that feels dry or thin. Your hair will most likely return to normal after your baby is born. WHAT TO EXPECT AT YOUR PRENATAL VISITS During a routine prenatal visit:  You will be weighed to make sure you and the fetus are growing normally.  Your blood pressure will be taken.  Your abdomen will be measured to track your baby's growth.  The fetal heartbeat will be listened to.  Any test results from the previous visit will be discussed. Your health care provider may ask you:  How you are feeling.  If you are feeling the baby move.  If you have had any abnormal symptoms, such as leaking fluid, bleeding, severe headaches, or abdominal cramping.  If you are using any tobacco products, including cigarettes, chewing tobacco, and electronic cigarettes.  If you have any questions. Other tests that may be performed during your second trimester include:  Blood tests that check for:  Low iron levels (anemia).  Gestational diabetes (between 24 and 28 weeks).  Rh antibodies.  Urine tests to check for infections, diabetes, or protein in the urine.  An ultrasound to confirm the proper growth and development of   the baby.  An amniocentesis to check for possible genetic problems.  Fetal screens for spina bifida and Down syndrome.  HIV (human immunodeficiency virus) testing. Routine prenatal testing includes screening for HIV, unless you choose not to have this test. HOME CARE INSTRUCTIONS   Avoid all smoking, herbs, alcohol, and unprescribed drugs. These chemicals affect the formation and growth of the baby.  Do not use any tobacco products, including cigarettes, chewing  tobacco, and electronic cigarettes. If you need help quitting, ask your health care provider. You may receive counseling support and other resources to help you quit.  Follow your health care provider's instructions regarding medicine use. There are medicines that are either safe or unsafe to take during pregnancy.  Exercise only as directed by your health care provider. Experiencing uterine cramps is a good sign to stop exercising.  Continue to eat regular, healthy meals.  Wear a good support bra for breast tenderness.  Do not use hot tubs, steam rooms, or saunas.  Wear your seat belt at all times when driving.  Avoid raw meat, uncooked cheese, cat litter boxes, and soil used by cats. These carry germs that can cause birth defects in the baby.  Take your prenatal vitamins.  Take 1500-2000 mg of calcium daily starting at the 20th week of pregnancy until you deliver your baby.  Try taking a stool softener (if your health care provider approves) if you develop constipation. Eat more high-fiber foods, such as fresh vegetables or fruit and whole grains. Drink plenty of fluids to keep your urine clear or pale yellow.  Take warm sitz baths to soothe any pain or discomfort caused by hemorrhoids. Use hemorrhoid cream if your health care provider approves.  If you develop varicose veins, wear support hose. Elevate your feet for 15 minutes, 3-4 times a day. Limit salt in your diet.  Avoid heavy lifting, wear low heel shoes, and practice good posture.  Rest with your legs elevated if you have leg cramps or low back pain.  Visit your dentist if you have not gone yet during your pregnancy. Use a soft toothbrush to brush your teeth and be gentle when you floss.  A sexual relationship may be continued unless your health care provider directs you otherwise.  Continue to go to all your prenatal visits as directed by your health care provider. SEEK MEDICAL CARE IF:   You have dizziness.  You  have mild pelvic cramps, pelvic pressure, or nagging pain in the abdominal area.  You have persistent nausea, vomiting, or diarrhea.  You have a bad smelling vaginal discharge.  You have pain with urination. SEEK IMMEDIATE MEDICAL CARE IF:   You have a fever.  You are leaking fluid from your vagina.  You have spotting or bleeding from your vagina.  You have severe abdominal cramping or pain.  You have rapid weight gain or loss.  You have shortness of breath with chest pain.  You notice sudden or extreme swelling of your face, hands, ankles, feet, or legs.  You have not felt your baby move in over an hour.  You have severe headaches that do not go away with medicine.  You have vision changes.   This information is not intended to replace advice given to you by your health care provider. Make sure you discuss any questions you have with your health care provider.   Document Released: 05/08/2001 Document Revised: 06/04/2014 Document Reviewed: 07/15/2012 Elsevier Interactive Patient Education 2016 Elsevier Inc.  

## 2015-08-23 NOTE — Progress Notes (Signed)
Subjective:  Elizabeth Hunt is a 30 y.o. G3P2002 at 3633w0d being seen today for ongoing prenatal care.  She is currently monitored for the following issues for this low-risk pregnancy and has Overweight (BMI 25.0-29.9); GAD (generalized anxiety disorder); GERD (gastroesophageal reflux disease); and Supervision of normal pregnancy, antepartum on her problem list.  Patient reports no complaints.  Contractions: Not present. Vag. Bleeding: None.  Movement: Absent. Denies leaking of fluid.   The following portions of the patient's history were reviewed and updated as appropriate: allergies, current medications, past family history, past medical history, past social history, past surgical history and problem list. Problem list updated.  Objective:   Filed Vitals:   08/23/15 0959  BP: 108/69  Pulse: 83  Weight: 151 lb (68.493 kg)    Fetal Status: Fetal Heart Rate (bpm): 168   Movement: Absent     General:  Alert, oriented and cooperative. Patient is in no acute distress.  Skin: Skin is warm and dry. No rash noted.   Cardiovascular: Normal heart rate noted  Respiratory: Normal respiratory effort, no problems with respiration noted  Abdomen: Soft, gravid, appropriate for gestational age. Pain/Pressure: Absent     Pelvic: Vag. Bleeding: None Vag D/C Character: Thin  Cervical exam deferred        Extremities: Normal range of motion.  Edema: None  Mental Status: Normal mood and affect. Normal behavior. Normal judgment and thought content.   Urinalysis: Urine Protein: Negative Urine Glucose: Negative  Assessment and Plan:  Pregnancy: G3P2002 at 4533w0d  1. Anxiety during pregnancy in second trimester, antepartum 2. GAD (generalized anxiety disorder) - hydrOXYzine (VISTARIL) 50 MG capsule; Take 1 capsule (50 mg total) by mouth 3 (three) times daily as needed for anxiety.  Dispense: 30 capsule; Refill: 2 Has been on Celexa in past and Xanax as needed.  Prescribed Vistaril. If ineffective, will  prescribe low dose Xanax  3. Supervision of normal pregnancy, antepartum, second trimester First screen normal, AFP lab next visit.  Anatomy scan around 19 weeks.  [place] at end of next month; next visit scheduled a little earlier in 3 weeks. Will also order anatomy scan. [place] will be sent to new OB provider. Routine obstetric precautions reviewed. Please refer to After Visit Summary for other counseling recommendations.  Return in about 3 weeks (around 09/13/2015) for OB Visit and AFP lab draw.   Tereso NewcomerUgonna A Kollen Armenti, MD

## 2015-09-12 ENCOUNTER — Telehealth: Payer: Self-pay

## 2015-09-12 NOTE — Telephone Encounter (Signed)
Elizabeth Hunt called and spoke with the after hours answering service on 09/09/2015 @0949  (closed for Good Friday). Elizabeth Hunt was requesting that we cancel her upcoming appointment on 09/13/2015 @0900  with Dr. Jolayne Pantheronstant. I returned Elizabeth Hunt's call today 09/12/2015 to confirm receipt of her message and to see if she needed to reschedule. She had previously alerted us that she would be moving and [place] [facility] (signed a release on 03/28/217-see scanned document), asked her records to be sent. Also provided her with MFM phone number so she could cancel her upcoming ultrasound appt.

## 2015-09-13 ENCOUNTER — Encounter: Payer: Managed Care, Other (non HMO) | Admitting: Obstetrics and Gynecology

## 2015-09-14 ENCOUNTER — Ambulatory Visit (HOSPITAL_COMMUNITY): Payer: Managed Care, Other (non HMO)

## 2016-04-17 ENCOUNTER — Ambulatory Visit
Admit: 2016-04-17 | Discharge: 2016-04-17 | Payer: PRIVATE HEALTH INSURANCE | Attending: Family | Primary: Internal Medicine

## 2016-04-17 DIAGNOSIS — K219 Gastro-esophageal reflux disease without esophagitis: Secondary | ICD-10-CM

## 2016-04-17 MED ORDER — RANITIDINE 150 MG CAP
150 mg | ORAL_CAPSULE | Freq: Two times a day (BID) | ORAL | 0 refills | Status: DC
Start: 2016-04-17 — End: 2016-04-24

## 2016-04-17 NOTE — Progress Notes (Signed)
HISTORY OF PRESENT ILLNESS  Erin Townsend is a 30 y.o. female present for acute visit  HPI  Chronic heart burn for several weeks, worse at night. Pain between breast and radiates to back, goes away with Tums and chewing gum    Long history of GERD and IBS, but not as severe    Sleeps on couch sitting up. Afraid to go to sleep 2 consecutive nights     Denies dietary or lifestyle changes. Tries to avoid problem foods    Past Medical History:   Diagnosis Date   ??? Allergic rhinitis    ??? Anxiety 09/26/2009   ??? Depression with anxiety    ??? Gallstones    ??? GERD (gastroesophageal reflux disease)    ??? Gyn exam     Dr. Rocco SereneLawrence Miller - [place] PAPs   ??? IBS (irritable bowel syndrome)      No current outpatient prescriptions on file prior to visit.     No current facility-administered medications on file prior to visit.                Review of Systems   Constitutional: Positive for malaise/fatigue.   HENT: Negative.    Respiratory: Negative.    Cardiovascular: Positive for chest pain.   Gastrointestinal: Positive for heartburn. Negative for blood in stool, constipation, diarrhea, melena, nausea and vomiting.   Musculoskeletal: Positive for back pain.   Neurological: Negative for dizziness and headaches.       Physical Exam   Constitutional: She is oriented to person, place, and time. She appears well-developed and well-nourished. No distress.   Eyes: Conjunctivae are normal. Right eye exhibits no discharge. Left eye exhibits no discharge. No scleral icterus.   Cardiovascular: Normal rate and normal heart sounds.    Pulmonary/Chest: Effort normal and breath sounds normal.   Abdominal: There is tenderness (epigstric).   Lymphadenopathy:     She has no cervical adenopathy.   Neurological: She is alert and oriented to person, place, and time.   Skin: Skin is warm and dry. She is not diaphoretic.   Psychiatric: Her mood appears anxious.       ASSESSMENT and PLAN    ICD-10-CM ICD-9-CM     1. Gastroesophageal reflux disease without esophagitis K21.9 530.81 REFERRAL TO GASTROENTEROLOGY      DISCONTINUED: raNITIdine hcl 150 mg capsule   2. Encounter for immunization Z23 V03.89 INFLUENZA VIRUS VAC QUAD,SPLIT,PRESV FREE SYRINGE IM     Follow-up Disposition:  Return if symptoms worsen or fail to improve.  reviewed medications and side effects in detail    Discussed likely PUD    Patient strongly encouraged to follow a bland diet until symptoms resolve, avoid large meals, chewing gum, schedule appointment with GI ASAP    Patient voices understanding and acceptance of this advice and will call back if any further questions or concerns.    An After Visit Summary was printed and given to the patient.

## 2016-04-17 NOTE — Patient Instructions (Signed)
Gastroesophageal Reflux Disease (GERD): Care Instructions  Your Care Instructions    Gastroesophageal reflux disease (GERD) is the backward flow of stomach acid into the esophagus. The esophagus is the tube that leads from your throat to your stomach. A one-way valve prevents the stomach acid from moving up into this tube. When you have GERD, this valve does not close tightly enough.  If you have mild GERD symptoms including heartburn, you may be able to control the problem with antacids or over-the-counter medicine. Changing your diet, losing weight, and making other lifestyle changes can also help reduce symptoms.  Follow-up care is a key part of your treatment and safety. Be sure to make and go to all appointments, and call your doctor if you are having problems. It's also a good idea to know your test results and keep a list of the medicines you take.  How can you care for yourself at home?  ?? Take your medicines exactly as prescribed. Call your doctor if you think you are having a problem with your medicine.  ?? Your doctor may recommend over-the-counter medicine. For mild or occasional indigestion, antacids, such as Tums, Gaviscon, Mylanta, or Maalox, may help. Your doctor also may recommend over-the-counter acid reducers, such as Pepcid AC, Tagamet HB, Zantac 75, or Prilosec. Read and follow all instructions on the label. If you use these medicines often, talk with your doctor.  ?? Change your eating habits.  ?? It's best to eat several small meals instead of two or three large meals.  ?? After you eat, wait 2 to 3 hours before you lie down.  ?? Chocolate, mint, and alcohol can make GERD worse.  ?? Spicy foods, foods that have a lot of acid (like tomatoes and oranges), and coffee can make GERD symptoms worse in some people. If your symptoms are worse after you eat a certain food, you may want to stop eating that food to see if your symptoms get better.   ?? Do not smoke or chew tobacco. Smoking can make GERD worse. If you need help quitting, talk to your doctor about stop-smoking programs and medicines. These can increase your chances of quitting for good.  ?? If you have GERD symptoms at night, raise the head of your bed 6 to 8 inches by putting the frame on blocks or placing a foam wedge under the head of your mattress. (Adding extra pillows does not work.)  ?? Do not wear tight clothing around your middle.  ?? Lose weight if you need to. Losing just 5 to 10 pounds can help.  When should you call for help?  Call your doctor now or seek immediate medical care if:  ? ?? You have new or different belly pain.   ? ?? Your stools are black and tarlike or have streaks of blood.   ?Watch closely for changes in your health, and be sure to contact your doctor if:  ? ?? Your symptoms have not improved after 2 days.   ? ?? Food seems to catch in your throat or chest.   Where can you learn more?  Go to http://www.healthwise.net/GoodHelpConnections.  Enter T927 in the search box to learn more about "Gastroesophageal Reflux Disease (GERD): Care Instructions."  Current as of: Oct 07, 2015  Content Version: 11.4  ?? 2006-2017 [employer]. Care instructions adapted under license by [employer] (which disclaims liability or warranty for this information). If you have questions about a medical condition or this instruction, always ask   your healthcare professional. [employer] Inc[employer] disclaims any warranty or liability for your use of this information.

## 2016-04-17 NOTE — Progress Notes (Signed)
RM#9  Chief Complaint   Patient presents with   ??? GERD     medication not working     1. Have you been to the ER, urgent care clinic since your last visit?  Hospitalized since your last visit?No    2. Have you seen or consulted any other health care providers outside of the [facility] since your last visit?  Include any pap smears or colon screening. No  Health Maintenance Due   Topic Date Due   ??? DTaP/Tdap/Td series (1 - Tdap) 08/15/2006   ??? Influenza Age 889 to Adult  12/27/2015

## 2016-04-18 ENCOUNTER — Emergency Department: Admit: 2016-04-19 | Payer: BLUE CROSS/BLUE SHIELD | Primary: Internal Medicine

## 2016-04-18 ENCOUNTER — Inpatient Hospital Stay
Admit: 2016-04-18 | Discharge: 2016-04-19 | Disposition: A | Discharge status: Home / Self Care | Payer: BLUE CROSS/BLUE SHIELD | Attending: Emergency Medicine

## 2016-04-18 DIAGNOSIS — R1013 Epigastric pain: Secondary | ICD-10-CM

## 2016-04-18 MED ORDER — SODIUM CHLORIDE 0.9% BOLUS IV
0.9 % | Freq: Once | INTRAVENOUS | Status: AC
Start: 2016-04-18 — End: 2016-04-18
  Administered 2016-04-19: 01:00:00 via INTRAVENOUS

## 2016-04-18 MED ORDER — ONDANSETRON (PF) 4 MG/2 ML INJECTION
4 mg/2 mL | INTRAMUSCULAR | Status: AC
Start: 2016-04-18 — End: 2016-04-18
  Administered 2016-04-19: 01:00:00 via INTRAVENOUS

## 2016-04-18 MED ORDER — LIDOCAINE 2 % MUCOSAL SOLN
2 % | Freq: Once | Status: AC
Start: 2016-04-18 — End: 2016-04-18
  Administered 2016-04-19: 01:00:00 via ORAL

## 2016-04-18 MED FILL — SODIUM CHLORIDE 0.9 % IV: INTRAVENOUS | Qty: 1000

## 2016-04-18 NOTE — ED Provider Notes (Addendum)
Patient is a 30 y.o. female presenting with epigastric pain.   Epigastric Pain           30y F here with epigastric pain that radiates to her back. Ongoing for a few weeks but worse in the past 2 days. Was intermittent but now constant. Saw her doctor yesterday who thought it might be an ulcer. Prescribed ranitidine but it's not helping. Also taking tylenol without relief. Some nausea and vomiting. No fever. Pain also radiates across both sides. States she had similar sx's about a year ago, but they resolved with omeprazole. She tried this again at the onset this time, but it didn't help. No change in appetite. When this occurred the first time, she removed foods from her diet that could be related to reflux and then slowly introduced them back in without incident. She is scheduled to see GI in 5 days for the same symptoms.     Past Medical History:   Diagnosis Date   ??? Allergic rhinitis    ??? Anxiety 09/26/2009   ??? Depression with anxiety    ??? GERD (gastroesophageal reflux disease)    ??? Gyn exam     Dr. Rocco SereneLawrence Miller - NL PAPs   ??? IBS (irritable bowel syndrome)        Past Surgical History:   Procedure Laterality Date   ??? HX GYN     ??? HX WISDOM TEETH EXTRACTION      no complications         Family History:   Problem Relation Age of Onset   ??? Cancer Mother      Breast Cancer   ??? Hypertension Mother    ??? Hypertension Father        Social History     Social History   ??? Marital status: MARRIED     Spouse name: N/A   ??? Number of children: N/A   ??? Years of education: N/A     Occupational History   ??? Not on file.     Social History Main Topics   ??? Smoking status: Never Smoker   ??? Smokeless tobacco: Never Used   ??? Alcohol use Yes      Comment: rarely   ??? Drug use: No   ??? Sexual activity: Yes     Partners: Male     Birth control/ protection: Pill     Other Topics Concern   ??? Not on file     Social History Narrative         ALLERGIES: Pcn [penicillins]    Review of Systems   Review of Systems    Constitutional: (-) weight loss.   HEENT: (-) stiff neck   Eyes: (-) discharge.   Respiratory: (-) for cough.    Cardiovascular: (-) syncope.   Gastrointestinal: (-) blood in stool.   Genitourinary: (-) hematuria.  Musculoskeletal: (-) myalgias.   Neurological: (-) seizure.   Skin: (-) petechiae  Lymph/Immunologic: (-) enlarged lymph nodes  All other systems reviewed and are negative.        Vitals:    04/18/16 1831   BP: 146/88   Pulse: (!) 56   Resp: 16   Temp: 97.9 ??F (36.6 ??C)   SpO2: 98%   Weight: 75.3 kg (166 lb)   Height: 5\' 2"  (1.575 m)            Physical Exam Nursing note and vitals reviewed.  Constitutional: oriented to person, place, and time. appears well-developed and well-nourished.  No distress.  Head: Normocephalic and atraumatic. Sclera anicteric  Nose: No rhinorrhea  Mouth/Throat: Oropharynx is clear and moist. Pharynx normal  Eyes: Conjunctivae are normal. Pupils are equal, round, and reactive to light. Right eye exhibits no discharge. Left eye exhibits no discharge.  Neck: Painless normal range of motion. Neck supple. No LAD.  Cardiovascular: Normal rate, regular rhythm, normal heart sounds and intact distal pulses.  Exam reveals no gallop and no friction rub.  No murmur heard.  Pulmonary/Chest:  No respiratory distress. No wheezes. No rales. No rhonchi. No increased work of breathing. No accessory muscle use. Good air exchange throughout.  Abdominal: soft, non-tender, no rebound or guarding. No hepatosplenomegaly. Normal bowel sounds throughout.  Back: no tenderness to palpation, no deformities, no CVA tenderness  Extremities/Musculoskeletal: Normal range of motion. no tenderness. No edema. Distal extremities are neurovasc intact.  Lymphadenopathy:   No adenopathy.   Neurological:  Alert and oriented to person, place, and time. Coordination normal. CN 2-12 intact. Motor and sensory function intact.  Skin: Skin is warm and dry. No rash noted. No pallor.        MDM 30y F here with epigastric pain. Will check labs and ultrasound as well as a GI cocktail.    ED Course       Procedures    8:50 PM  Pt says she's not feeling better. Will give lortab. Gallstones seen on ultrasound but no signs of cholecystitis. Will d/w surgery.

## 2016-04-18 NOTE — ED Notes (Signed)
She was seen yesterday for abdominal pain with radiation to her back. She was dx with a possible ulcer. She is taking pain medication for that but it is not helping. She is rating her pain 9/10 consistently since yesterday.

## 2016-04-18 NOTE — ED Notes (Signed)
Pt given discharge instructions.  Pt had no questions regarding instructions.  Pt ambulatory with slow, steady gait at the time of discharge.

## 2016-04-19 LAB — URINALYSIS W/MICROSCOPIC
Bacteria: NEGATIVE /hpf
Blood: NEGATIVE
Glucose: NEGATIVE mg/dL
Ketone: 40 mg/dL — AB
Leukocyte Esterase: NEGATIVE
Nitrites: NEGATIVE
Protein: NEGATIVE mg/dL
Specific gravity: 1.025 (ref 1.003–1.030)
Urobilinogen: 0.2 EU/dL (ref 0.2–1.0)
pH (UA): 5.5 (ref 5.0–8.0)

## 2016-04-19 LAB — CBC WITH AUTOMATED DIFF
ABS. BASOPHILS: 0 10*3/uL (ref 0.0–0.1)
ABS. EOSINOPHILS: 0 10*3/uL (ref 0.0–0.4)
ABS. LYMPHOCYTES: 1.6 10*3/uL (ref 0.8–3.5)
ABS. MONOCYTES: 0.3 10*3/uL (ref 0.0–1.0)
ABS. NEUTROPHILS: 2.7 10*3/uL (ref 1.8–8.0)
BASOPHILS: 0 % (ref 0–1)
EOSINOPHILS: 1 % (ref 0–7)
HCT: 33.4 % — ABNORMAL LOW (ref 35.0–47.0)
HGB: 11.3 g/dL — ABNORMAL LOW (ref 11.5–16.0)
LYMPHOCYTES: 35 % (ref 12–49)
MCH: 29.3 PG (ref 26.0–34.0)
MCHC: 33.8 g/dL (ref 30.0–36.5)
MCV: 86.5 FL (ref 80.0–99.0)
MONOCYTES: 7 % (ref 5–13)
NEUTROPHILS: 57 % (ref 32–75)
PLATELET: 214 10*3/uL (ref 150–400)
RBC: 3.86 M/uL (ref 3.80–5.20)
RDW: 12.6 % (ref 11.5–14.5)
WBC: 4.6 10*3/uL (ref 3.6–11.0)

## 2016-04-19 LAB — METABOLIC PANEL, COMPREHENSIVE
A-G Ratio: 0.9 — ABNORMAL LOW (ref 1.1–2.2)
ALT (SGPT): 40 U/L (ref 12–78)
AST (SGOT): 29 U/L (ref 15–37)
Albumin: 4 g/dL (ref 3.5–5.0)
Alk. phosphatase: 123 U/L — ABNORMAL HIGH (ref 45–117)
Anion gap: 9 mmol/L (ref 5–15)
BUN/Creatinine ratio: 17 (ref 12–20)
BUN: 14 MG/DL (ref 6–20)
Bilirubin, total: 0.4 MG/DL (ref 0.2–1.0)
CO2: 27 mmol/L (ref 21–32)
Calcium: 9.4 MG/DL (ref 8.5–10.1)
Chloride: 101 mmol/L (ref 97–108)
Creatinine: 0.82 MG/DL (ref 0.55–1.02)
GFR est AA: >60 mL/min/{1.73_m2} (ref >60)
GFR est non-AA: >60 mL/min/{1.73_m2} (ref >60)
Globulin: 4.3 g/dL — ABNORMAL HIGH (ref 2.0–4.0)
Glucose: 76 mg/dL (ref 65–100)
Potassium: 3.7 mmol/L (ref 3.5–5.1)
Protein, total: 8.3 g/dL — ABNORMAL HIGH (ref 6.4–8.2)
Sodium: 137 mmol/L (ref 136–145)

## 2016-04-19 LAB — LIPASE: Lipase: 227 U/L (ref 73–393)

## 2016-04-19 LAB — URINE CULTURE HOLD SAMPLE

## 2016-04-19 LAB — BILIRUBIN, CONFIRM: Bilirubin UA, confirm: NEGATIVE

## 2016-04-19 MED ORDER — HYDROCODONE-ACETAMINOPHEN 5 MG-325 MG TAB
5-325 mg | ORAL_TABLET | ORAL | 0 refills | Status: DC | PRN
Start: 2016-04-19 — End: 2016-04-25

## 2016-04-19 MED ORDER — SUCRALFATE 100 MG/ML ORAL SUSP
100 mg/mL | Freq: Four times a day (QID) | ORAL | 0 refills | Status: DC
Start: 2016-04-19 — End: 2016-04-24

## 2016-04-19 MED ORDER — ONDANSETRON 4 MG TAB, RAPID DISSOLVE
4 mg | ORAL_TABLET | Freq: Three times a day (TID) | ORAL | 1 refills | Status: DC | PRN
Start: 2016-04-19 — End: 2016-05-09

## 2016-04-19 MED ORDER — HYDROCODONE-ACETAMINOPHEN 10 MG-325 MG TAB
10-325 mg | ORAL | Status: AC
Start: 2016-04-19 — End: 2016-04-18
  Administered 2016-04-19: 02:00:00 via ORAL

## 2016-04-19 MED ORDER — ONDANSETRON 4 MG TAB, RAPID DISSOLVE
4 mg | ORAL_TABLET | Freq: Three times a day (TID) | ORAL | 1 refills | Status: DC | PRN
Start: 2016-04-19 — End: 2016-04-18

## 2016-04-19 MED FILL — ONDANSETRON (PF) 4 MG/2 ML INJECTION: 4 mg/2 mL | INTRAMUSCULAR | Qty: 2

## 2016-04-19 MED FILL — MAG-AL PLUS 200 MG-200 MG-20 MG/5 ML ORAL SUSPENSION: 200-200-20 mg/5 mL | ORAL | Qty: 30

## 2016-04-19 MED FILL — HYDROCODONE-ACETAMINOPHEN 10 MG-325 MG TAB: 10-325 mg | ORAL | Qty: 1

## 2016-04-23 NOTE — Telephone Encounter (Signed)
Husband thought surgery would be schedule, he made appointment for his wife.

## 2016-04-24 ENCOUNTER — Ambulatory Visit: Admit: 2016-04-24 | Payer: PRIVATE HEALTH INSURANCE | Attending: Surgery | Primary: Internal Medicine

## 2016-04-24 ENCOUNTER — Ambulatory Visit
Admit: 2016-04-24 | Discharge: 2016-04-24 | Payer: PRIVATE HEALTH INSURANCE | Attending: Surgery | Primary: Internal Medicine

## 2016-04-24 DIAGNOSIS — K811 Chronic cholecystitis: Secondary | ICD-10-CM

## 2016-04-24 DIAGNOSIS — K8020 Calculus of gallbladder without cholecystitis without obstruction: Secondary | ICD-10-CM

## 2016-04-24 MED ORDER — HYDROCODONE-ACETAMINOPHEN 5 MG-325 MG TAB
5-325 mg | ORAL_TABLET | Freq: Four times a day (QID) | ORAL | 0 refills | Status: DC | PRN
Start: 2016-04-24 — End: 2016-04-25

## 2016-04-24 NOTE — Patient Instructions (Signed)
Cholecystectomy: Before Your Surgery  What is cholecystectomy?  Cholecystectomy (ko-luh-sis-TEK-tuh-mee) is a type of surgery. It removes a diseased gallbladder.  This surgery is usually done as a laparoscopic surgery. The doctor puts a lighted tube and other surgical tools through small cuts (incisions) in your belly. The tube is called a scope. It lets your doctor see your organs so he or she can do the surgery. The incisions leave scars that fade with time.  Most people go home the same day. You probably will feel better each day. Most people have only a small amount of pain after 1 week. If you have a desk job, you can probably go back to work in 1 to 2 weeks. If you lift heavy objects or have a very active job, it may take up to 4 weeks.  In some cases, open surgery is the best choice. Your doctor may choose open surgery in advance. Or he or she may choose it in the middle of laparoscopic surgery. In open surgery, the doctor makes a larger incision in your upper belly. If you have open surgery, you will probably stay in the hospital for 2 to 4 days. And it may take 4 to 6 weeks to get back to your normal routine.  Follow-up care is a key part of your treatment and safety. Be sure to make and go to all appointments, and call your doctor if you are having problems. It's also a good idea to know your test results and keep a list of the medicines you take.  What happens before surgery?  ?Surgery can be stressful. This information will help you understand what you can expect. And it will help you safely prepare for surgery.  ?Preparing for surgery  ? ?? Understand exactly what surgery is planned, along with the risks, benefits, and other options.    ?? Tell your doctors ALL the medicines, vitamins, supplements, and herbal remedies you take. Some of these can increase the risk of bleeding or interact with anesthesia.   ? ?? If you take blood thinners, such as warfarin (Coumadin), clopidogrel  (Plavix), or aspirin, be sure to talk to your doctor. He or she will tell you if you should stop taking these medicines before your surgery. Make sure that you understand exactly what your doctor wants you to do.   ? ?? Your doctor will tell you which medicines to take or stop before your surgery. You may need to stop taking certain medicines a week or more before surgery. So talk to your doctor as soon as you can.   ? ?? If you have an advance directive, let your doctor know. It may include a living will and a durable power of attorney for health care. Bring a copy to the hospital. If you don't have one, you may want to prepare one. It lets your doctor and loved ones know your health care wishes. Doctors advise that everyone prepare these papers before any type of surgery or procedure.   ? ?? You may need to empty your colon with an enema or laxative. Your doctor will tell you how to do this.   What happens on the day of surgery?   ?? Follow the instructions exactly about when to stop eating and drinking. If you don't, your surgery may be canceled. If your doctor told you to take your medicines on the day of surgery, take them with only a sip of water.   ? ?? Take a bath or shower   before you come in for your surgery. Do not apply lotions, perfumes, deodorants, or nail polish.   ? ?? Do not shave the surgical site yourself.   ? ?? Take off all jewelry and piercings. And take out contact lenses, if you wear them.   ?At the hospital or surgery center   ?? Bring a picture ID.   ? ?? The area for surgery is often marked to make sure there are no errors.   ? ?? You will be kept comfortable and safe by your anesthesia provider. You will be asleep during the surgery.   ? ?? The surgery usually takes 1 to 2 hours.   Going home   ?? Be sure you have someone to drive you home. Anesthesia and pain medicine make it unsafe for you to drive.   ? ?? You will be given more specific instructions about recovering from  your surgery. They will cover things like diet, wound care, follow-up care, driving, and getting back to your normal routine.   When should you call your doctor?   ?? You have questions or concerns.   ? ?? You don't understand how to prepare for your surgery.   ? ?? You become ill before the surgery (such as fever, flu, or a cold).   ? ?? You need to reschedule or have changed your mind about having the surgery.   Where can you learn more?  Go to http://www.healthwise.net/GoodHelpConnections.  Enter G508 in the search box to learn more about "Cholecystectomy: Before Your Surgery."  Current as of: Oct 07, 2015  Content Version: 11.4  ?? 2006-2017 [employer]. Care instructions adapted under license by [employer] (which disclaims liability or warranty for this information). If you have questions about a medical condition or this instruction, always ask your healthcare professional. [employer] disclaims any warranty or liability for your use of this information.

## 2016-04-24 NOTE — Progress Notes (Signed)
Erroneous encounter-disregard.

## 2016-04-24 NOTE — Other (Signed)
TELEPHONE PRE-OP INTERVIEW: PRE-OP INSTRUCTIONS GIVEN, INCLUDING NPO AFTER MIDNIGHT, MEDICATIONS TO BRING DOS AND MEDICATIONS TO TAKE DOS. ALSO INCLUDED WERE MEDICATIONS TO STOP PRIOR TO SURGERY. PATIENT VOICED UNDERSTANDING OF SAME.

## 2016-04-24 NOTE — Progress Notes (Signed)
Subjective:      Erin Townsend is a 30 y.o. female who is being seen for evaluation of abdominal pain.  The pain is located in the RUQ with radiation to R back.  Pain is described as sharp and stabbing and measures 8/10 in intensity.  Onset of pain was 1 year ago with recent worsening of symptoms. Aggravating factors include eating. Alleviating factors include Hydrocodone. Associated symptoms include nausea, sweats and vomiting.    Patient Active Problem List    Diagnosis Date Noted   ??? 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 Cholelithiasis 04/24/2016   ??? 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 Anxiety 09/26/2009   ??? Neck pain 09/26/2009   ??? Allergic rhinitis    ??? IBS (irritable bowel syndrome)      Past Medical History:   Diagnosis Date   ??? Allergic rhinitis    ??? Anxiety 09/26/2009   ??? Depression with anxiety    ??? Gallstones    ??? GERD (gastroesophageal reflux disease)    ??? Gyn exam     Dr. Rocco SereneLawrence Miller - NL PAPs   ??? IBS (irritable bowel syndrome)       Past Surgical History:   Procedure Laterality Date   ??? HX HEENT      wisdom teeth removed   ??? HX WISDOM TEETH EXTRACTION      no complications      Social History   Substance Use Topics   ??? Smoking status: Never Smoker   ??? Smokeless tobacco: Never Used   ??? Alcohol use Yes      Comment: rarely      Family History   Problem Relation Age of Onset   ??? Cancer Mother      Breast Cancer   ??? Hypertension Mother    ??? Hypertension Father       Current Outpatient Prescriptions   Medication Sig   ??? HYDROcodone-acetaminophen (NORCO) 5-325 mg per tablet Take 1 Tab by mouth every six (6) hours as needed for Pain. Max Daily Amount: 4 Tabs.   ??? HYDROcodone-acetaminophen (NORCO) 5-325 mg per tablet Take 1 Tab by mouth every four (4) hours as needed for Pain. Max Daily Amount: 6 Tabs.   ??? ondansetron (ZOFRAN ODT) 4 mg disintegrating tablet Take 1 Tab by mouth every eight (8) hours as needed for Nausea.     No current facility-administered medications for this visit.       Allergies    Allergen Reactions   ??? Pcn [Penicillins] Rash       Review of Systems:    A complete review of systems was negative except as noted in the HPI.    Objective:        Visit Vitals   ??? BP 110/70   ??? Pulse 89   ??? Temp 98.1 ??F (36.7 ??C)   ??? Resp 16   ??? Ht 5\' 2"  (1.575 m)   ??? Wt 168 lb (76.2 kg)   ??? LMP  (LMP Unknown)   ??? BMI 30.73 kg/m2       Physical Exam:  GENERAL: alert, cooperative, no distress, appears stated age, EYE: negative, LYMPH NODES: Cervical, supraclavicular nodes normal. THROAT & NECK: normal, LUNG: clear to auscultation bilaterally, HEART: regular rate and rhythm, S1, S2 normal, no murmur. ABDOMEN: NABS, soft, non-distended. Mild upper abdominal pain with palpation. EXTREMITIES:  extremities normal, atraumatic, no cyanosis or edema, SKIN: Normal., NEUROLOGIC: negative    Imaging:  reviewed  Ultrasound  Lab/Data Review[facility]  [facility].    Assessment:     Abdominal pain, suspect chronic cholecystitis.    Plan:     1. I recommend proceeding with laparoscopic cholecystectomy.     2. Discussed aspects of surgical intervention, methods, risks (including by not limited to infection, bleeding, hematoma, bile duct injury, conversion to open procedure, and perforation of the intestines or solid organs), and the risks of general anesthetic.  The patient understands the risks; all questions were answered to the patient's satisfaction.    3. Patient does wish to proceed with surgery.      30 minutes spent with patient (greater than 50% of time spent in face-face consultation reviewing technical aspects of procedure, indications for surgery, risks, and anticipated hospital course).      Signed By: Kennyth ArnoldBrennan Tanji Storrs, MD     April 25, 2016

## 2016-04-24 NOTE — Progress Notes (Signed)
Chief Complaint:   Symptomatic cholelithiasis    HPI:  30 yo woman with no significant PMH who presented to clinic for evaluation of abdominal pain.  Pt reported pain developed several weeks ago which worsened last week.  Pt reported epigastric pain radiating to back.  She also reported nausea and vomiting.  Pt had seen her PCP who offered her omeprazole for possible PUD but she reported her symptoms have not improved.  No fever or chills.  Pt went to ED where workup showed cholelithiasis without cholecystitis.  All laboratory work are otherwise normal.    Past Medical History:   Diagnosis Date   ??? Allergic rhinitis    ??? Anxiety 09/26/2009   ??? Depression with anxiety    ??? Gallstones    ??? GERD (gastroesophageal reflux disease)    ??? Gyn exam     Dr. Rocco SereneLawrence Miller - NL PAPs   ??? IBS (irritable bowel syndrome)        Past Surgical History:   Procedure Laterality Date   ??? HX GYN     ??? HX HEENT      wisdom teeth removed   ??? HX WISDOM TEETH EXTRACTION      no complications     Current Outpatient Prescriptions on File Prior to Visit   Medication Sig Dispense Refill   ??? HYDROcodone-acetaminophen (NORCO) 5-325 mg per tablet Take 1 Tab by mouth every four (4) hours as needed for Pain. Max Daily Amount: 6 Tabs. 11 Tab 0   ??? raNITIdine hcl 150 mg capsule Take 150 mg by mouth two (2) times a day. 60 Cap 0   ??? sucralfate (CARAFATE) 100 mg/mL suspension Take 5 mL by mouth four (4) times daily. 100 mL 0   ??? ondansetron (ZOFRAN ODT) 4 mg disintegrating tablet Take 1 Tab by mouth every eight (8) hours as needed for Nausea. 5 Tab 1   ??? citalopram (CELEXA) 40 mg tablet TAKE 1 TAB BY MOUTH DAILY. 90 Tab 3   ??? ALPRAZolam (XANAX) 0.25 mg tablet TAKE 1 TABLET BY MOUTH TWICE A DAY AS NEEDED FOR ANXIETY 20 Tab 0   ??? TRI-PREVIFEM, 28, 0.18/0.215/0.25 mg-35 mcg (28) tablet Take  by mouth daily.  4   ??? FLUVIRIN 2015-2016 susp injection   0   ??? fluticasone (FLONASE) 50 mcg/actuation nasal spray 2 Sprays by Both Nostrils route daily. 1 Bottle 5    ??? PRENATAL VITAMINS/FE SULF/FA (PRENATAL MULTIVIT WITH IRON PO) Take  by mouth.     ??? cetirizine (ZYRTEC) 10 mg tablet Take  by mouth as needed.     ??? cyclobenzaprine (FLEXERIL) 10 mg tablet Take 0.5 Tabs by mouth three (3) times daily as needed for Muscle Spasm(s). 20 Tab 1     No current facility-administered medications on file prior to visit.        Allergies   Allergen Reactions   ??? Pcn [Penicillins] Rash       Review of Systems - General ROS: negative  Psychological ROS: negative  Respiratory ROS: negative  Cardiovascular ROS: negative  Gastrointestinal ROS: positive for - abdominal pain and nausea/vomiting    Visit Vitals   ??? BP 110/70 (BP 1 Location: Right arm, BP Patient Position: Sitting)   ??? Pulse 89   ??? Temp 98.1 ??F (36.7 ??C) (Oral)   ??? Resp 16   ??? Ht 5\' 2"  (1.575 m)   ??? Wt 166 lb (75.3 kg)   ??? SpO2 98%   ??? BMI 30.36  kg/m2         Physical Exam:    Gen:  NAD  Pulm:  Unlabored  Abd:  S/ND/mild TTP in epigastric area.  No murphy sign    No results found for this or any previous visit (from the past 24 hour(s)).    AP: 30 yo woman with abdominal pain    - Abdominal pain:  Most likely from symptomatic cholelithiasis  - Discussed risks and benefit of lap cholecystectomy with patient and husband.  Pt reported symptoms are significant and would like to have resolution as soon as possible  - As I am off this week, will refer patient to Dr. Verne Grainarmody who may be able to offer patient lap cholecystectomy tomorrow  - Patient sent to see Dr. Verne Grainarmody today.

## 2016-04-24 NOTE — Progress Notes (Signed)
1. Have you been to the ER, urgent care clinic since your last visit?  Hospitalized since your last visit?new patient    2. Have you seen or consulted any other health care providers outside of the [facility] since your last visit?  Include any pap smears or colon screening. New patient

## 2016-04-24 NOTE — Progress Notes (Signed)
1. Have you been to the ER, urgent care clinic since your last visit?  Hospitalized since your last visit? ER visit 04/18/16  Nausea, back pain and stomach pain - in [facility]    2. Have you seen or consulted any other health care providers outside of the [facility] since your last visit?  Include any pap smears or colon screening. No    Patient is  Currently breastfeeding her 122 month old baby.

## 2016-04-25 ENCOUNTER — Inpatient Hospital Stay: Payer: BLUE CROSS/BLUE SHIELD

## 2016-04-25 DIAGNOSIS — K811 Chronic cholecystitis: Secondary | ICD-10-CM

## 2016-04-25 LAB — HCG URINE, QL. - POC: Pregnancy test,urine (POC): NEGATIVE

## 2016-04-25 MED ORDER — DIPHENHYDRAMINE HCL 50 MG/ML IJ SOLN
50 mg/mL | INTRAMUSCULAR | Status: DC | PRN
Start: 2016-04-25 — End: 2016-04-25

## 2016-04-25 MED ORDER — ROCURONIUM 10 MG/ML IV
10 mg/mL | INTRAVENOUS | Status: DC | PRN
Start: 2016-04-25 — End: 2016-04-25
  Administered 2016-04-25: 18:00:00 via INTRAVENOUS

## 2016-04-25 MED ORDER — BUPIVACAINE (PF) 0.5 % (5 MG/ML) IJ SOLN
0.5 % (5 mg/mL) | INTRAMUSCULAR | Status: DC | PRN
Start: 2016-04-25 — End: 2016-04-25
  Administered 2016-04-25: 20:00:00 via SUBCUTANEOUS

## 2016-04-25 MED ORDER — PROPOFOL 10 MG/ML IV EMUL
10 mg/mL | INTRAVENOUS | Status: DC | PRN
Start: 2016-04-25 — End: 2016-04-25
  Administered 2016-04-25: 18:00:00 via INTRAVENOUS

## 2016-04-25 MED ORDER — MEPERIDINE (PF) 50 MG/ML INJ SOLN
50 mg/ml | INTRAMUSCULAR | Status: DC | PRN
Start: 2016-04-25 — End: 2016-04-25
  Administered 2016-04-25: 18:00:00 via INTRAVENOUS

## 2016-04-25 MED ORDER — FENTANYL CITRATE (PF) 50 MCG/ML IJ SOLN
50 mcg/mL | INTRAMUSCULAR | Status: AC | PRN
Start: 2016-04-25 — End: 2016-04-25
  Administered 2016-04-25 (×4): via INTRAVENOUS

## 2016-04-25 MED ORDER — MIDAZOLAM 1 MG/ML IJ SOLN
1 mg/mL | INTRAMUSCULAR | Status: DC | PRN
Start: 2016-04-25 — End: 2016-04-25

## 2016-04-25 MED ORDER — ACETAMINOPHEN 1,000 MG/100 ML (10 MG/ML) IV
1000 mg/100 mL (10 mg/mL) | INTRAVENOUS | Status: DC | PRN
Start: 2016-04-25 — End: 2016-04-25
  Administered 2016-04-25: 18:00:00 via INTRAVENOUS

## 2016-04-25 MED ORDER — MIDAZOLAM 1 MG/ML IJ SOLN
1 mg/mL | INTRAMUSCULAR | Status: DC | PRN
Start: 2016-04-25 — End: 2016-04-25
  Administered 2016-04-25: 18:00:00 via INTRAVENOUS

## 2016-04-25 MED ORDER — LACTATED RINGERS IV
INTRAVENOUS | Status: DC
Start: 2016-04-25 — End: 2016-04-25
  Administered 2016-04-25: 17:00:00 via INTRAVENOUS

## 2016-04-25 MED ORDER — SODIUM CHLORIDE 0.9 % IJ SYRG
Freq: Three times a day (TID) | INTRAMUSCULAR | Status: DC
Start: 2016-04-25 — End: 2016-04-25

## 2016-04-25 MED ORDER — ROPIVACAINE (PF) 5 MG/ML (0.5 %) INJECTION
5 mg/mL (0. %) | INTRAMUSCULAR | Status: DC | PRN
Start: 2016-04-25 — End: 2016-04-25

## 2016-04-25 MED ORDER — FENTANYL CITRATE (PF) 50 MCG/ML IJ SOLN
50 mcg/mL | INTRAMUSCULAR | Status: DC | PRN
Start: 2016-04-25 — End: 2016-04-25
  Administered 2016-04-25 (×3): via INTRAVENOUS

## 2016-04-25 MED ORDER — SCOPOLAMINE (1.3-1.5) MG 72 HR TRANSDERM PATCH
1 mg over 3 days | TRANSDERMAL | Status: DC | PRN
Start: 2016-04-25 — End: 2016-04-25
  Administered 2016-04-25: 18:00:00 via TRANSDERMAL

## 2016-04-25 MED ORDER — BUPIVACAINE (PF) 0.5 % (5 MG/ML) IJ SOLN
0.5 % (5 mg/mL) | Freq: Once | INTRAMUSCULAR | Status: DC
Start: 2016-04-25 — End: 2016-04-25

## 2016-04-25 MED ORDER — SODIUM CHLORIDE 0.9 % IV
INTRAVENOUS | Status: DC
Start: 2016-04-25 — End: 2016-04-25

## 2016-04-25 MED ORDER — HYDROMORPHONE (PF) 1 MG/ML IJ SOLN
1 mg/mL | INTRAMUSCULAR | Status: DC | PRN
Start: 2016-04-25 — End: 2016-04-25

## 2016-04-25 MED ORDER — SODIUM CHLORIDE 0.9 % IV
40 mg/mL | INTRAVENOUS | Status: AC
Start: 2016-04-25 — End: 2016-04-25
  Administered 2016-04-25 (×2): via INTRAVENOUS

## 2016-04-25 MED ORDER — LIDOCAINE (PF) 20 MG/ML (2 %) IJ SOLN
20 mg/mL (2 %) | INTRAMUSCULAR | Status: DC | PRN
Start: 2016-04-25 — End: 2016-04-25
  Administered 2016-04-25: 18:00:00 via INTRAVENOUS

## 2016-04-25 MED ORDER — HYDROCODONE-ACETAMINOPHEN 5 MG-325 MG TAB
5-325 mg | Freq: Once | ORAL | Status: DC
Start: 2016-04-25 — End: 2016-04-25

## 2016-04-25 MED ORDER — MORPHINE 10 MG/ML INJ SOLUTION
10 mg/ml | INTRAMUSCULAR | Status: DC | PRN
Start: 2016-04-25 — End: 2016-04-25
  Administered 2016-04-25 (×2): via INTRAVENOUS

## 2016-04-25 MED ORDER — ONDANSETRON (PF) 4 MG/2 ML INJECTION
4 mg/2 mL | INTRAMUSCULAR | Status: DC | PRN
Start: 2016-04-25 — End: 2016-04-25
  Administered 2016-04-25: 20:00:00 via INTRAVENOUS

## 2016-04-25 MED ORDER — MEPERIDINE (PF) 50 MG/ML INJ SOLN
50 mg/ml | INTRAMUSCULAR | Status: AC
Start: 2016-04-25 — End: ?

## 2016-04-25 MED ORDER — SODIUM CHLORIDE 0.9 % IJ SYRG
INTRAMUSCULAR | Status: DC | PRN
Start: 2016-04-25 — End: 2016-04-25

## 2016-04-25 MED ORDER — LACTATED RINGERS IV
INTRAVENOUS | Status: DC
Start: 2016-04-25 — End: 2016-04-25

## 2016-04-25 MED ORDER — ONDANSETRON 4 MG TAB, RAPID DISSOLVE
4 mg | ORAL_TABLET | Freq: Three times a day (TID) | ORAL | 0 refills | Status: DC | PRN
Start: 2016-04-25 — End: 2016-05-09

## 2016-04-25 MED ORDER — FENTANYL CITRATE (PF) 50 MCG/ML IJ SOLN
50 mcg/mL | INTRAMUSCULAR | Status: DC | PRN
Start: 2016-04-25 — End: 2016-04-25

## 2016-04-25 MED ORDER — GLYCOPYRROLATE 0.2 MG/ML IJ SOLN
0.2 mg/mL | INTRAMUSCULAR | Status: DC | PRN
Start: 2016-04-25 — End: 2016-04-25
  Administered 2016-04-25: 20:00:00 via INTRAVENOUS

## 2016-04-25 MED ORDER — ACETAMINOPHEN 1,000 MG/100 ML (10 MG/ML) IV
1000 mg/100 mL (10 mg/mL) | INTRAVENOUS | Status: AC
Start: 2016-04-25 — End: ?

## 2016-04-25 MED ORDER — EPHEDRINE SULFATE 50 MG/ML IJ SOLN
50 mg/mL | INTRAMUSCULAR | Status: DC | PRN
Start: 2016-04-25 — End: 2016-04-25

## 2016-04-25 MED ORDER — OXYCODONE 5 MG TAB
5 mg | ORAL | Status: DC | PRN
Start: 2016-04-25 — End: 2016-04-25

## 2016-04-25 MED ORDER — FENTANYL CITRATE (PF) 50 MCG/ML IJ SOLN
50 mcg/mL | INTRAMUSCULAR | Status: AC
Start: 2016-04-25 — End: ?

## 2016-04-25 MED ORDER — MEPERIDINE (PF) 25 MG/ML INJ SOLUTION
25 mg/ml | Freq: Once | INTRAMUSCULAR | Status: AC
Start: 2016-04-25 — End: 2016-04-25
  Administered 2016-04-25 (×2): via INTRAVENOUS

## 2016-04-25 MED ORDER — HYDROCODONE-ACETAMINOPHEN 5 MG-325 MG TAB
5-325 mg | ORAL_TABLET | ORAL | 0 refills | Status: DC | PRN
Start: 2016-04-25 — End: 2016-05-09

## 2016-04-25 MED ORDER — METRONIDAZOLE IN SODIUM CHLORIDE (ISO-OSM) 500 MG/100 ML IV PIGGY BACK
500 mg/100 mL | INTRAVENOUS | Status: AC
Start: 2016-04-25 — End: 2016-04-25
  Administered 2016-04-25: 18:00:00 via INTRAVENOUS

## 2016-04-25 MED ORDER — HYDROCODONE-ACETAMINOPHEN 5 MG-325 MG TAB
5-325 mg | ORAL | Status: AC
Start: 2016-04-25 — End: 2016-04-25
  Administered 2016-04-25: 22:00:00

## 2016-04-25 MED ORDER — LIDOCAINE (PF) 10 MG/ML (1 %) IJ SOLN
10 mg/mL (1 %) | INTRAMUSCULAR | Status: DC | PRN
Start: 2016-04-25 — End: 2016-04-25
  Administered 2016-04-25: 17:00:00 via SUBCUTANEOUS

## 2016-04-25 MED ORDER — MIDAZOLAM 1 MG/ML IJ SOLN
1 mg/mL | INTRAMUSCULAR | Status: AC
Start: 2016-04-25 — End: ?

## 2016-04-25 MED ORDER — NEOSTIGMINE METHYLSULFATE 1 MG/ML INJECTION
1 mg/mL | INTRAMUSCULAR | Status: DC | PRN
Start: 2016-04-25 — End: 2016-04-25
  Administered 2016-04-25: 20:00:00 via INTRAVENOUS

## 2016-04-25 MED ORDER — DEXAMETHASONE SODIUM PHOSPHATE 4 MG/ML IJ SOLN
4 mg/mL | INTRAMUSCULAR | Status: DC | PRN
Start: 2016-04-25 — End: 2016-04-25
  Administered 2016-04-25: 18:00:00 via INTRAVENOUS

## 2016-04-25 MED FILL — MEPERIDINE (PF) 50 MG/ML IJ SOLN: 50 mg/mL | INTRAMUSCULAR | Qty: 1

## 2016-04-25 MED FILL — LACTATED RINGERS IV: INTRAVENOUS | Qty: 1000

## 2016-04-25 MED FILL — SODIUM CHLORIDE 0.9 % IV: INTRAVENOUS | Qty: 100

## 2016-04-25 MED FILL — METRONIDAZOLE IN SODIUM CHLORIDE (ISO-OSM) 500 MG/100 ML IV PIGGY BACK: 500 mg/100 mL | INTRAVENOUS | Qty: 100

## 2016-04-25 MED FILL — ONDANSETRON (PF) 4 MG/2 ML INJECTION: 4 mg/2 mL | INTRAMUSCULAR | Qty: 2

## 2016-04-25 MED FILL — FENTANYL CITRATE (PF) 50 MCG/ML IJ SOLN: 50 mcg/mL | INTRAMUSCULAR | Qty: 5

## 2016-04-25 MED FILL — MEPERIDINE (PF) 25MG/ML INJECTION: 25 mg/mL | INTRAMUSCULAR | Qty: 1

## 2016-04-25 MED FILL — OFIRMEV 1,000 MG/100 ML (10 MG/ML) INTRAVENOUS SOLUTION: 1000 mg/100 mL (10 mg/mL) | INTRAVENOUS | Qty: 100

## 2016-04-25 MED FILL — MIDAZOLAM 1 MG/ML IJ SOLN: 1 mg/mL | INTRAMUSCULAR | Qty: 2

## 2016-04-25 MED FILL — HYDROCODONE-ACETAMINOPHEN 5 MG-325 MG TAB: 5-325 mg | ORAL | Qty: 1

## 2016-04-25 MED FILL — FENTANYL CITRATE (PF) 50 MCG/ML IJ SOLN: 50 mcg/mL | INTRAMUSCULAR | Qty: 2

## 2016-04-25 MED FILL — MORPHINE 10 MG/ML SYRINGE: 10 mg/mL | INTRAMUSCULAR | Qty: 1

## 2016-04-25 NOTE — Op Note (Signed)
[facility]   [address]   [place], [place] [id number]   OP NOTE       Name:  Erin Townsend, Erin L   MR#:  [id number]   DOB:  [birth year]   Account #:  [id number]    Surgery Date:  04/25/2016   Date of Adm:  04/25/2016       PREOPERATIVE DIAGNOSIS: Chronic cholecystitis.    POSTOPERATIVE DIAGNOSIS: Acute cholecystitis.    PROCEDURES PERFORMED: Laparoscopic cholecystectomy.    SURGEON: Zollie PeeBrennan J. Sherine Cortese, MD    ANESTHESIA: General endotracheal.    DRAINS: None.    SPONGE COUNT: Correct.    NEEDLE COUNT: Correct.    SPECIMEN REMOVED: Gallbladder with contents.    ESTIMATED BLOOD LOSS: 100 mL.    COMPLICATIONS: None.    INDICATIONS: The patient is a 30 year old white female with a 1-year   history of intermittent upper abdominal pain radiating to her back,   associated with nausea and vomiting. Symptoms severely exacerbated   over the last 10 days, and [facility] 1 week ago and referred for surgical   evaluation. On exam, she has upper abdominal pain with palpation.   [facility]   [facility] were normal. Ultrasound revealed cholelithiasis. Her   findings are consistent with chronic cholecystitis. She is taken to the   operating today for laparoscopic cholecystectomy.    FINDINGS: Acute cholecystitis, superimposed on chronic inflammatory   changes.    DESCRIPTION OF PROCEDURE: The patient was identified as the   correct patient in the preoperative holding area and informed consent   was confirmed. After answering the patient's remaining questions, she   was taken to the operating room and placed on the operating room   table in the supine position. Sequential compression devices were   placed on both lower extremities. Following the uneventful initiation of   general anesthesia, she was carefully secured to the operating room   table with foot board and safety strap in place. All potential  pressure   points were padded with egg crate. Her abdomen was prepped and   draped in the usual sterile fashion. Final time-out was performed, and it   was confirmed she had received intravenous antibiotics.   Pneumoperitoneum was achieved using a closed Veress needle   technique. Once adequate working space had been developed, a 5   mm trocar was inserted through a small right-sided skin incision using   an Optiview technique. After confirming intraperitoneal location of the   trocar tip, insufflation was switched to the trocar and the Veress needle   was removed. The 5 mm, 30-degree laparoscope was inserted. No   signs of trocar injury or Veress needle injury were present. The   gallbladder was noted to be distended. The pericholecystic fluid was   present in the subhepatic area. A 12 mm trocar was inserted through a   small infraumbilical incision using visual guidance with the   laparoscope. The patient was placed in the steep reverse   Trendelenburg position. Two additional 5 mm trocars were inserted   through small upper abdominal incisions using identical technique. The   gallbladder was palpated and noted to be tense, precluding grasping   with standard laparoscopic equipment. Therefore, 30 mL of clear bile,   consistent with hydrops of the gallbladder, were aspirated from the   lumen of the gallbladder. This allowed the  gallbladder to be grasped at   the fundus with retraction oriented cephalad. Adhesions between   pericolic fat and the duodenum were taken down from the body and   infundibulum of the gallbladder using electrocautery and blunt   dissection. Once freed, the infundibulum was retracted laterally, thus   opening Calot's triangle. Acute inflammatory changes consistent with   acute cholecystitis were present with thickened peritoneal tissue and   pericholecystic fluid with wall thickening identified. Thickened   peritoneal tissue was stripped away from the infundibulum in the   direction of the  portal structures. Calot's node was identified and noted   to be enlarged. With continued dissection, the cystic duct was   identified and noted to be thickened and shortened. Circumferential   dissection of this structure was performed using careful blunt   dissection. With continued medial dissection, the cystic artery was   identified and likewise circumferentially dissected. Given the thickness   of the cystic duct, it became clear that standard Endoclips would not   control the duct. Therefore, the epigastric port was upsized to a 12 mm   sleeve and the cystic duct was controlled with a firing of a tan load 30   mm linear stapler firing. The cystic artery was doubly ligated between   large titanium Hemoclips and then divided. The gallbladder was   dissected free from the gallbladder fossa of the liver using   electrocautery and blunt dissection. Chronic inflammatory changes   were identified along this plane. A posterior branch of the cystic artery   was identified and controlled with large titanium hemoclips. As the   dissection was carried towards the fundus of the gallbladder, a ductal   structure entering the gallbladder, consistent with a duct of Luschka   was identified. It was controlled with large titanium clips and then   divided. Once freed from the liver, the gallbladder was placed within a   nylon rip stop retrieval bag and removed from the patient's body. Once   pneumoperitoneum had been re-established, the subhepatic area was   irrigated with sterile saline. Small bleeding points within the hepatic   parenchyma were controlled with electrocautery and Arista. After   confirming adequate hemostasis, the infraumbilical fascial defect was   closed with a series of 0 Vicryl sutures, placed using a laparoscopic   suture passer with 0 Vicryl sutures. Pneumoperitoneum was released,   and all trocars were removed. All wounds were infiltrated with 0.5%   Marcaine without epinephrine. All skin edges were  reapproximated   with a combination of subcuticular 4-0 Monocryl suture and   Dermabond. The patient tolerated the procedure well. She was   extubated in the operating room and transported to the recovery area   in stable condition.   The attending surgeon, Dr. Verne Grainarmody, was scrubbed and present for   the entire procedure.        Zollie PeeBRENNAN J. Tamrah Victorino, MD      BC / THS   D:  04/25/2016   17:11   T:  04/26/2016   06:52   Job #:  [id number]

## 2016-04-25 NOTE — Anesthesia Pre-Procedure Evaluation (Signed)
Anesthetic History   No history of anesthetic complications            Review of Systems / Medical History  Patient summary reviewed, nursing notes reviewed and pertinent labs reviewed    Pulmonary  Within defined limits                 Neuro/Psych         Psychiatric history     Cardiovascular  Within defined limits                Exercise tolerance: >4 METS     GI/Hepatic/Renal     GERD: well controlled           Endo/Other  Within defined limits           Other Findings   Comments: Gallstones           Physical Exam    Airway  Mallampati: II  TM Distance: 4 - 6 cm  Neck ROM: normal range of motion   Mouth opening: Normal     Cardiovascular  Regular rate and rhythm,  S1 and S2 normal,  no murmur, click, rub, or gallop             Dental  No notable dental hx       Pulmonary  Breath sounds clear to auscultation               Abdominal  GI exam deferred       Other Findings            Anesthetic Plan    ASA: 2  Anesthesia type: general          Induction: Intravenous  Anesthetic plan and risks discussed with: Patient

## 2016-04-25 NOTE — Other (Signed)
One vial of arista used by dr carmody during case  Ref [id number]

## 2016-04-25 NOTE — Brief Op Note (Signed)
BRIEF OPERATIVE NOTE    Date of Procedure: 04/25/2016   Preoperative Diagnosis: CHRONIC CHOLECYSTITIS   Postoperative Diagnosis: ACUTE CHOLECYSTITIS  Procedure(s):  LAPAROSCOPIC CHOLECYSTECTOMY  Surgeon(s) and Role:     * Kennyth ArnoldBrennan Petronella Shuford, MD - Primary         Assistant Staff:       Surgical Staff:  Circ-1: Hillery AldoMary S Vickerman, RN  Circ-Relief: Sherryl MangesAshley E Boswell, RN  Scrub Tech-1: Dallie PilesJessica A Lambert  Surg Asst-1: Vivia BirminghamVineida G Gilliam  Event Time In   Incision Start 1305   Incision Close      Anesthesia: General   Estimated Blood Loss: 100 cc   Specimens:   ID Type Source Tests Collected by Time Destination   1 : GALLBLADDER Fresh Abdomen  Kennyth ArnoldBrennan Alylah Blakney, MD 04/25/2016 1330 Pathology   2 :     Kennyth ArnoldBrennan Queen Abbett, MD 04/25/2016 1330 Pathology      Findings: acute cholecystitis superimposed on chronic cholecystitis   Complications: none  Implants: * No implants in log *

## 2016-04-25 NOTE — Other (Signed)
HUSBAND CALLED TO NOTIFY OF START OF SURGERY AND ALL GOING WELL

## 2016-04-25 NOTE — Other (Signed)
patinets husband called to notify he will leave the hospital to pick up their child and can be reached on cell phone, asked for updat, told all was proceeding as expected.

## 2016-04-25 NOTE — Op Note (Signed)
[facility]   [address]   [place], [place] [id number]   OP NOTE       Name:  Erin Townsend, Erin L   MR#:  [id number]   DOB:  1985-07-11   Account #:  [id number]    Surgery Date:  04/25/2016   Date of Adm:  04/25/2016       PREOPERATIVE DIAGNOSIS: Chronic cholecystitis.    POSTOPERATIVE DIAGNOSIS: Acute cholecystitis.    PROCEDURES PERFORMED: Laparoscopic cholecystectomy.    SURGEON: Zollie PeeBrennan J. Marcela Alatorre, MD    ANESTHESIA: General endotracheal.    DRAINS: None.    SPONGE COUNT: Correct.    NEEDLE COUNT: Correct.    SPECIMEN REMOVED: Gallbladder with contents.    ESTIMATED BLOOD LOSS: 100 mL.    COMPLICATIONS: None.    INDICATIONS: The patient is a 30 year old white female with a 1-year   history of intermittent upper abdominal pain radiating to her back,   associated with nausea and vomiting. Symptoms severely exacerbated   over the last 10 days, and [facility] 1 week ago and referred for surgical   evaluation. On exam, she has upper abdominal pain with palpation.   [facility]   [facility] were normal. Ultrasound revealed cholelithiasis. Her   findings are consistent with chronic cholecystitis. She is taken to the   operating today for laparoscopic cholecystectomy.    FINDINGS: Acute cholecystitis, superimposed on chronic inflammatory   changes.    DESCRIPTION OF PROCEDURE: The patient was identified as the   correct patient in the preoperative holding area and informed consent   was confirmed. After answering the patient's remaining questions, she   was taken to the operating room and placed on the operating room   table in the supine position. Sequential compression devices were   placed on both lower extremities. Following the uneventful initiation of   general anesthesia, she was carefully secured to the operating room    table with foot board and safety strap in place. All potential pressure   points were padded with egg crate. Her abdomen was prepped and   draped in the usual sterile fashion. Final time-out was performed, and it   was confirmed she had received intravenous antibiotics.   Pneumoperitoneum was achieved using a closed Veress needle   technique. Once adequate working space had been developed, a 5   mm trocar was inserted through a small right-sided skin incision using   an Optiview technique. After confirming intraperitoneal location of the   trocar tip, insufflation was switched to the trocar and the Veress needle   was removed. The 5 mm, 30-degree laparoscope was inserted. No   signs of trocar injury or Veress needle injury were present. The   gallbladder was noted to be distended. The pericholecystic fluid was   present in the subhepatic area. A 12 mm trocar was inserted through a   small infraumbilical incision using visual guidance with the   laparoscope. The patient was placed in the steep reverse   Trendelenburg position. Two additional 5 mm trocars were inserted   through small upper abdominal incisions using identical technique. The   gallbladder was palpated and noted to be tense, precluding grasping   with standard laparoscopic equipment. Therefore, 30 mL of clear bile,   consistent with hydrops of the gallbladder, were aspirated from the   lumen of the gallbladder. This allowed the  gallbladder to be grasped at   the fundus with retraction oriented cephalad. Adhesions between   pericolic fat and the duodenum were taken down from the body and   infundibulum of the gallbladder using electrocautery and blunt   dissection. Once freed, the infundibulum was retracted laterally, thus   opening Calot's triangle. Acute inflammatory changes consistent with   acute cholecystitis were present with thickened peritoneal tissue and   pericholecystic fluid with wall thickening identified. Thickened    peritoneal tissue was stripped away from the infundibulum in the   direction of the portal structures. Calot's node was identified and noted   to be enlarged. With continued dissection, the cystic duct was   identified and noted to be thickened and shortened. Circumferential   dissection of this structure was performed using careful blunt   dissection. With continued medial dissection, the cystic artery was   identified and likewise circumferentially dissected. Given the thickness   of the cystic duct, it became clear that standard Endoclips would not   control the duct. Therefore, the epigastric port was upsized to a 12 mm   sleeve and the cystic duct was controlled with a firing of a tan load 30   mm linear stapler firing. The cystic artery was doubly ligated between   large titanium Hemoclips and then divided. The gallbladder was   dissected free from the gallbladder fossa of the liver using   electrocautery and blunt dissection. Chronic inflammatory changes   were identified along this plane. A posterior branch of the cystic artery   was identified and controlled with large titanium hemoclips. As the   dissection was carried towards the fundus of the gallbladder, a ductal   structure entering the gallbladder, consistent with a duct of Luschka   was identified. It was controlled with large titanium clips and then   divided. Once freed from the liver, the gallbladder was placed within a   nylon rip stop retrieval bag and removed from the patient's body. Once   pneumoperitoneum had been re-established, the subhepatic area was   irrigated with sterile saline. Small bleeding points within the hepatic   parenchyma were controlled with electrocautery and Arista. After   confirming adequate hemostasis, the infraumbilical fascial defect was   closed with a series of 0 Vicryl sutures, placed using a laparoscopic   suture passer with 0 Vicryl sutures. Pneumoperitoneum was released,    and all trocars were removed. All wounds were infiltrated with 0.5%   Marcaine without epinephrine. All skin edges were reapproximated   with a combination of subcuticular 4-0 Monocryl suture and   Dermabond. The patient tolerated the procedure well. She was   extubated in the operating room and transported to the recovery area   in stable condition.   The attending surgeon, Dr. Verne Grainarmody, was scrubbed and present for   the entire procedure.        Zollie PeeBRENNAN J. Shaylie Eklund, MD      BC / THS   D:  04/25/2016   17:11   T:  04/26/2016   06:52   Job #:  [id number]

## 2016-04-25 NOTE — H&P (Signed)
??  Subjective:   ????  Erin Townsend is a 30 y.o. female who is being seen for evaluation of abdominal pain.  The pain is located in the RUQ with radiation to R back.  Pain is described as sharp and stabbing and measures 8/10 in intensity.  Onset of pain was 1 year ago with recent worsening of symptoms. Aggravating factors include eating. Alleviating factors include Hydrocodone. Associated symptoms include nausea, sweats and vomiting.  ??       Patient Active Problem List   ?? Diagnosis Date Noted   ??? 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 Cholelithiasis 04/24/2016   ??? 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 Anxiety 09/26/2009   ??? Neck pain 09/26/2009   ??? Allergic rhinitis ??   ??? IBS (irritable bowel syndrome) ??   ??       Past Medical History:   Diagnosis Date   ??? Allergic rhinitis ??   ??? Anxiety 09/26/2009   ??? Depression with anxiety ??   ??? Gallstones ??   ??? GERD (gastroesophageal reflux disease) ??   ??? Gyn exam ??   ?? Dr. Rocco SereneLawrence Miller - NL PAPs   ??? IBS (irritable bowel syndrome) ??            Past Surgical History:   Procedure Laterality Date   ??? HX HEENT ?? ??   ?? wisdom teeth removed   ??? HX WISDOM TEETH EXTRACTION ?? ??   ?? no complications      Social History    Substance Use Topics    ??? Smoking status: Never Smoker    ??? Smokeless tobacco: Never Used    ??? Alcohol use Yes   ?? ?? ?? Comment: rarely              Family History   Problem Relation Age of Onset   ??? Cancer Mother ??   ?? ?? Breast Cancer   ??? Hypertension Mother ??   ??? Hypertension Father ??           Current Outpatient Prescriptions   Medication Sig   ??? HYDROcodone-acetaminophen (NORCO) 5-325 mg per tablet Take 1 Tab by mouth every six (6) hours as needed for Pain. Max Daily Amount: 4 Tabs.   ??? HYDROcodone-acetaminophen (NORCO) 5-325 mg per tablet Take 1 Tab by mouth every four (4) hours as needed for Pain. Max Daily Amount: 6 Tabs.   ??? ondansetron (ZOFRAN ODT) 4 mg disintegrating tablet Take 1 Tab by mouth every eight (8) hours as needed for Nausea.   ??   No current facility-administered medications for this visit.            Allergies   Allergen Reactions   ??? Pcn [Penicillins] Rash   ??  ??  Review of Systems:    A complete review of systems was negative except as noted in the HPI.  ??  Objective:   ??  ????    Physical Exam:  GENERAL: alert, cooperative, no distress, appears stated age, EYE: negative, LYMPH NODES: Cervical, supraclavicular nodes normal. THROAT & NECK: normal, LUNG: clear to auscultation bilaterally, HEART: regular rate and rhythm, S1, S2 normal, no murmur. ABDOMEN: NABS, soft, non-distended. Mild upper abdominal pain with palpation. EXTREMITIES:  extremities normal, atraumatic, no cyanosis or edema, SKIN: Normal., NEUROLOGIC: negative.  ??  Imaging:  reviewed  Ultrasound  ??  Lab[facility]Data Review[facility]  [facility].  ??  Assessment:   ??  Abdominal pain, suspect chronic cholecystitis.  ??  Plan:   ??  1. I recommend proceeding with laparoscopic cholecystectomy.   ??  2. Discussed aspects of surgical intervention, methods, risks (including by not limited to infection, bleeding, hematoma, bile duct injury, conversion to open procedure, and perforation of the intestines or solid organs), and the risks of general anesthetic.  The patient understands the risks; all questions were answered to the patient's satisfaction.  ??  3. Patient does wish to proceed with surgery.

## 2016-04-27 MED FILL — DEXAMETHASONE SODIUM PHOSPHATE 4 MG/ML IJ SOLN: 4 mg/mL | INTRAMUSCULAR | Qty: 2

## 2016-04-27 MED FILL — NEOSTIGMINE METHYLSULFATE 5 MG/5 ML (1 MG/ML) IV SYRINGE: 5 mg/ mL (1 mg/mL) | INTRAVENOUS | Qty: 100

## 2016-04-27 MED FILL — ROCURONIUM 10 MG/ML IV: 10 mg/mL | INTRAVENOUS | Qty: 3

## 2016-04-27 MED FILL — NEOSTIGMINE METHYLSULFATE 5 MG/5 ML (1 MG/ML) IV SYRINGE: 5 mg/ mL (1 mg/mL) | INTRAVENOUS | Qty: 3.5

## 2016-04-27 MED FILL — DIPRIVAN 10 MG/ML INTRAVENOUS EMULSION: 10 mg/mL | INTRAVENOUS | Qty: 20

## 2016-04-27 MED FILL — GLYCOPYRROLATE 0.2 MG/ML IJ SOLN: 0.2 mg/mL | INTRAMUSCULAR | Qty: 2

## 2016-04-27 MED FILL — XYLOCAINE-MPF 20 MG/ML (2 %) INJECTION SOLUTION: 20 mg/mL (2 %) | INTRAMUSCULAR | Qty: 3

## 2016-04-27 MED FILL — TRANSDERM-SCOP 1 MG OVER 3 DAYS TRANSDERMAL PATCH: 1 mg over 3 days | TRANSDERMAL | Qty: 1

## 2016-04-27 MED FILL — OFIRMEV 1,000 MG/100 ML (10 MG/ML) INTRAVENOUS SOLUTION: 1000 mg/100 mL (10 mg/mL) | INTRAVENOUS | Qty: 100

## 2016-04-27 NOTE — Anesthesia Post-Procedure Evaluation (Signed)
Post-Anesthesia Evaluation and Assessment    Patient: Erin Townsend MRN: [id number]  SSN: [id number]    Date of Birth: [birth year]  Age: 30 y.o.  Sex: female       Cardiovascular Function/Vital Signs  Visit Vitals   ??? BP 128/74   ??? Pulse 71   ??? Temp 36.7 ??C (98 ??F)   ??? Resp 17   ??? Ht 5\' 2"  (1.575 m)   ??? Wt 76.2 kg (167 lb 15.9 oz)   ??? SpO2 97%   ??? BMI 30.73 kg/m2       Patient is status post general anesthesia for Procedure(s):  LAPAROSCOPIC CHOLECYSTECTOMY.    Nausea/Vomiting: None    Postoperative hydration reviewed and adequate.    Pain:  Pain Scale 1: Adult Nonverbal Pain Scale (04/25/16 1621)  Pain Intensity 1: 0 (04/25/16 1621)   Managed    Neurological Status:   Neuro (WDL): Within Defined Limits (04/25/16 1509)   At baseline    Mental Status and Level of Consciousness: Arousable    Pulmonary Status:   O2 Device: Nasal cannula (04/25/16 1509)   Adequate oxygenation and airway patent    Complications related to anesthesia: None    Post-anesthesia assessment completed. No concerns    Signed By: Cristobal GoldmannJames C Dquan Cortopassi, MD     April 27, 2016

## 2016-05-02 ENCOUNTER — Encounter (HOSPITAL_COMMUNITY): Payer: Self-pay

## 2016-05-09 ENCOUNTER — Ambulatory Visit
Admit: 2016-05-09 | Discharge: 2016-05-09 | Payer: PRIVATE HEALTH INSURANCE | Attending: Family | Primary: Internal Medicine

## 2016-05-09 DIAGNOSIS — Z09 Encounter for follow-up examination after completed treatment for conditions other than malignant neoplasm: Secondary | ICD-10-CM

## 2016-05-09 NOTE — Progress Notes (Signed)
1. Have you been to the ER, urgent care clinic since your last visit?  Hospitalized since your last visit?No    2. Have you seen or consulted any other health care providers outside of the  Health System since your last visit?  Include any pap smears or colon screening. No

## 2016-05-09 NOTE — Patient Instructions (Addendum)
Cholecystectomy: What to Expect at Home  Your Recovery  After your surgery, it is normal to feel weak and tired for several days after you return home. Your belly may be swollen. If you had laparoscopic surgery, you may also have pain in your shoulder for about 24 hours.  You may have gas or need to burp a lot at first, and a few people get diarrhea. The diarrhea usually goes away in 2 to 4 weeks, but it may last longer.  How quickly you recover depends on whether you had a laparoscopic or open surgery.  ?? For a laparoscopic surgery, most people can go back to work or their normal routine in 1 to 2 weeks, but it may take longer, depending on the type of work you do.  ?? For an open surgery, it will probably take 4 to 6 weeks before you get back to your normal routine.  This care sheet gives you a general idea about how long it will take for you to recover. However, each person recovers at a different pace. Follow the steps below to get better as quickly as possible.  How can you care for yourself at home?  Activity  ? ?? Rest when you feel tired. Getting enough sleep will help you recover.   ? ?? Try to walk each day. Start out by walking a little more than you did the day before. Gradually increase the amount you walk. Walking boosts blood flow and helps prevent pneumonia and constipation.   ? ?? For about 2 to 4 weeks, avoid lifting anything that would make you strain. This may include a child, heavy grocery bags and milk containers, a heavy briefcase or backpack, cat litter or dog food bags, or a vacuum cleaner.   ? ?? Avoid strenuous activities, such as biking, jogging, weightlifting, and aerobic exercise, until your doctor says it is okay.   ? ?? You may shower 24 to 48 hours after surgery, if your doctor okays it. Pat the cut (incision) dry. Do not take a bath for the first 2 weeks, or until your doctor tells you it is okay.   ? ?? You may drive when you are no longer taking pain medicine and can  quickly move your foot from the gas pedal to the brake. You must also be able to sit comfortably for a long period of time, even if you do not plan to go far. You might get caught in traffic.   ? ?? For a laparoscopic surgery, most people can go back to work or their normal routine in 1 to 2 weeks, but it may take longer. For an open surgery, it will probably take 4 to 6 weeks before you get back to your normal routine.   ? ?? Your doctor will tell you when you can have sex again.   ?Diet  ? ?? Eat smaller meals more often instead of fewer larger meals. You can eat a normal diet, but avoid eating fatty foods for about 1 month. Fatty foods include hamburger, whole milk, cheese, and many snack foods. If your stomach is upset, try bland, low-fat foods like plain rice, broiled chicken, toast, and yogurt.   ? ?? Drink plenty of fluids (unless your doctor tells you not to).   ? ?? If you have diarrhea, try avoiding spicy foods, dairy products, fatty foods, and alcohol. You can also watch to see if specific foods cause it, and stop eating them. If the diarrhea continues for   more than 2 weeks, talk to your doctor.   ? ?? You may notice that your bowel movements are not regular right after your surgery. This is common. Try to avoid constipation and straining with bowel movements. You may want to take a fiber supplement every day. If you have not had a bowel movement after a couple of days, ask your doctor about taking a mild laxative.   Medicines  ? ?? Your doctor will tell you if and when you can restart your medicines. He or she will also give you instructions about taking any new medicines.   ? ?? If you take blood thinners, such as warfarin (Coumadin), clopidogrel (Plavix), or aspirin, be sure to talk to your doctor. He or she will tell you if and when to start taking those medicines again. Make sure that you understand exactly what your doctor wants you to do.   ? ?? Take pain medicines exactly as directed.   ?? If the doctor gave you a prescription medicine for pain, take it as prescribed.  ?? If you are not taking a prescription pain medicine, take an over-the-counter medicine such as acetaminophen (Tylenol), ibuprofen (Advil, Motrin), or naproxen (Aleve). Read and follow all instructions on the label.  ?? Do not take two or more pain medicines at the same time unless the doctor told you to. Many pain medicines contain acetaminophen, which is Tylenol. Too much Tylenol can be harmful.   ? ?? If you think your pain medicine is making you sick to your stomach:  ?? Take your medicine after meals (unless your doctor tells you not to).  ?? Ask your doctor for a different pain medicine.   ? ?? If your doctor prescribed antibiotics, take them as directed. Do not stop taking them just because you feel better. You need to take the full course of antibiotics.   Incision care  ? ?? If you have strips of tape on the incision, or cut, leave the tape on for a week or until it falls off.   ? ?? After 24 to 48 hours, wash the area daily with warm, soapy water, and pat it dry.   ? ?? You may have staples to hold the cut together. Keep them dry until your doctor takes them out. This is usually in 7 to 10 days.   ? ?? Keep the area clean and dry. You may cover it with a gauze bandage if it weeps or rubs against clothing. Change the bandage every day.   ?Ice  ? ?? To reduce swelling and pain, put ice or a cold pack on your belly for 10 to 20 minutes at a time. Do this every 1 to 2 hours. Put a thin cloth between the ice and your skin.   Follow-up care is a key part of your treatment and safety. Be sure to make and go to all appointments, and call your doctor if you are having problems. It's also a good idea to know your test results and keep a list of the medicines you take.  When should you call for help?  Call 911 anytime you think you may need emergency care. For example, call if:  ? ?? You passed out (lost consciousness).    ? ?? You are short of breath..   ?Call your doctor now or seek immediate medical care if:  ? ?? You are sick to your stomach and cannot drink fluids.   ? ?? You have pain that does   not get better when you take your pain medicine.   ? ?? You cannot pass stools or gas.   ? ?? You have signs of infection, such as:  ?? Increased pain, swelling, warmth, or redness.  ?? Red streaks leading from the incision.  ?? Pus draining from the incision.  ?? A fever.   ? ?? Bright red blood has soaked through the bandage over your incision.   ? ?? You have loose stitches, or your incision comes open.   ? ?? You have signs of a blood clot in your leg (called a deep vein thrombosis), such as:  ?? Pain in your calf, back of knee, thigh, or groin.  ?? Redness and swelling in your leg or groin.   ?Watch closely for any changes in your health, and be sure to contact your doctor if you have any problems.  Where can you learn more?  Go to http://www.healthwise.net/[employer].  Enter F357 in the search box to learn more about "Cholecystectomy: What to Expect at Home."  Current as of: Oct 07, 2015  Content Version: 11.4  ?? 2006-2017 [employer]. Care instructions adapted under license by [employer] (which disclaims liability or warranty for this information). If you have questions about a medical condition or this instruction, always ask your healthcare professional. [employer]wise, Inc[employer] disclaims any warranty or liability for your use of this information.

## 2016-05-09 NOTE — Progress Notes (Signed)
Subjective:      Erin Townsend is a 30 y.o. female presents for postop care 14 days following laparoscopic cholecystectomy by Dr. Albertina ParrBrennon Carmody. Appetite is good. Eating a regular diet. without difficulty. Bowel movements are regular. The patient is not having any pain. Pt would like to resume regular activity.Denies fever, nausea, shortness of breath, chest pain, redness at incision site, vomiting and diarrhea    Pathology:  Chronic cholecystitis with cholelithiasis    Advance directive not on file      Objective:     Visit Vitals   ??? BP 106/76   ??? Pulse 72   ??? Temp 97.8 ??F (36.6 ??C) (Oral)   ??? Resp 18   ??? Ht 5\' 2"  (1.575 m)   ??? Wt 158 lb (71.7 kg)   ??? LMP 04/25/2015  Comment: breastfeeding   ??? SpO2 98%   ??? BMI 28.9 kg/m2       General:  alert, no distress   Abdomen: soft, bowel sounds active, non-tender   Incision:   healing well, no drainage, no erythema, no seroma, no swelling, no dehiscence, incisions well approximated   Heart: regular rate and rhythm, S1, S2 normal, no murmur, click, rub or gallop   Lungs: clear to auscultation bilaterally     Assessment:     1. Chronic cholecystitis with cholelithiasis, status post lap chole. Doing well postoperatively.    Plan:     1. Pt is to increase activities as tolerated..  2. Follow-up: prn    Erin Townsend has a reminder for a "due or due soon" health maintenance. I have asked that she contact her primary care provider for follow-up on this health maintenance.    Patient verbalized understanding and agreement.

## 2016-05-14 ENCOUNTER — Encounter

## 2016-05-14 MED ORDER — RANITIDINE 150 MG CAP
150 mg | ORAL_CAPSULE | ORAL | 0 refills | Status: DC
Start: 2016-05-14 — End: 2019-07-20

## 2017-05-30 ENCOUNTER — Ambulatory Visit
Admit: 2017-05-30 | Discharge: 2017-05-30 | Payer: PRIVATE HEALTH INSURANCE | Attending: Internal Medicine | Primary: Internal Medicine

## 2017-05-30 DIAGNOSIS — Z0000 Encounter for general adult medical examination without abnormal findings: Secondary | ICD-10-CM

## 2017-05-30 MED ORDER — ALPRAZOLAM 0.25 MG TAB
0.25 mg | ORAL_TABLET | Freq: Two times a day (BID) | ORAL | 0 refills | Status: DC | PRN
Start: 2017-05-30 — End: 2017-11-11

## 2017-05-30 MED ORDER — CITALOPRAM 20 MG TAB
20 mg | ORAL_TABLET | Freq: Every day | ORAL | 5 refills | Status: DC
Start: 2017-05-30 — End: 2017-09-11

## 2017-05-30 NOTE — Progress Notes (Signed)
Subjective:   32 y.o. female for Well Woman Check.   Her gyne and breast care is done elsewhere by her Ob-Gyne physician.    Past medical, Social, and Family history reviewed  Medications reviewed and updated.     ROS: Feeling generally well. No TIA's or unusual headaches, no dysphagia.  No prolonged cough. No dyspnea or chest pain on exertion.  No abdominal pain, change in bowel habits, black or bloody stools.  No urinary tract symptoms.  No new or unusual musculoskeletal symptoms.    Specific concerns today:     Pt is s/p chole  Bloating and gassiness more so than diarrhea  Some constipation despite chole    +anxiety and stress  Off meds during pregnancy and BF/nursing    +IBS when younger.    Tdap during most recent pregnancy    Objective:   The patient appears well, alert, oriented x 3, in no distress.  Visit Vitals  BP 104/65 (BP 1 Location: Right arm, BP Patient Position: Sitting)   Pulse 77   Temp 97.7 ??F (36.5 ??C) (Oral)   Resp 16   Ht 5\' 2"  (1.575 m)   Wt 142 lb 12.8 oz (64.8 kg)   SpO2 100%   BMI 26.12 kg/m??     ENT normal.  Neck supple. No adenopathy or thyromegaly. PERLA. Lungs are clear, good air entry, no wheezes, rhonchi or rales. S1 and S2 normal, no murmurs, regular rate and rhythm. Abdomen soft without tenderness, guarding, mass or organomegaly. Extremities show no edema, normal peripheral pulses. Neurological is normal, no focal findings.  Breast and Pelvic exams are deferred.      Prior labs reviewed.      Assessment/Plan:   Well Woman  follow low fat diet, routine labs ordered, call if any problems    ICD-10-CM ICD-9-CM    1. Well woman exam (no gynecological exam) Z00.00 V70.0 CBC WITH AUTOMATED DIFF      LIPID PANEL      METABOLIC PANEL, COMPREHENSIVE      VITAMIN D, 25 HYDROXY    [V70.0]   2. Anxiety F41.9 300.00 citalopram (CELEXA) 20 mg tablet      ALPRAZolam (XANAX) 0.25 mg tablet   3. Allergic rhinitis, unspecified seasonality, unspecified trigger J30.9 477.9     4. Irritable bowel syndrome with both constipation and diarrhea K58.2 564.1    5. 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 S/P cholecystectomy Z90.49 V45.79      Follow-up Disposition:  Return in about 2 months (around 07/28/2017), or if symptoms worsen or fail to improve, for mood, GI symptoms.   results and schedule of future  studies reviewed with patient  reviewed diet, exercise and weight   cardiovascular risk and specific lipid/LDL goals reviewed  reviewed medications and side effects in detail   Resume SSRI  Prn xanax  Fiber   Probiotic   MVI  Consider Xifaxan - held for now  Labs at lab

## 2017-05-30 NOTE — Progress Notes (Signed)
Erin Townsend is a 32 y.o. female      Chief Complaint   Patient presents with   ??? Complete Physical     Pt is here for a CPE.          1. Have you been to the ER, urgent care clinic since your last visit?  Hospitalized since your last visit? No     2. Have you seen or consulted any other health care providers outside of the [facility] since your last visit?  Include any pap smears or colon screening.  No         Health Maintenance Due   Topic Date Due   ??? DTaP/Tdap/Td series (1 - Tdap) 08/15/2006   ??? PAP AKA CERVICAL CYTOLOGY  09/25/2016   ??? Influenza Age 809 to Adult  12/26/2016         Visit Vitals  BP 104/65 (BP 1 Location: Right arm, BP Patient Position: Sitting)   Pulse 77   Temp 97.7 ??F (36.5 ??C) (Oral)   Resp 16   Ht 5\' 2"  (1.575 m)   Wt 142 lb 12.8 oz (64.8 kg)   SpO2 100%   BMI 26.12 kg/m??

## 2017-05-30 NOTE — Progress Notes (Signed)
Vitamin D is a little low - take 1000 units of vitamin D daily.  HDL (good) cholesterol is low - regular exercise can improve this.  But, the remainder of your cholesterol profile, and most importantly the LDL, is normal.  Keep up the good work!   Other labs are normal.

## 2017-06-03 NOTE — Telephone Encounter (Signed)
Regarding: Prescription Question  Contact: [phone]  ----- Message from Mychart, Generic sent at 06/03/2017  9:38 AM EST -----    Hello Dr.  I was wondering how long the nausea from the Celex will last? Today is day 3 of taking it,  and have been experiencing All day nausea since day one. Should I just wait it out?   Thank you,  -Erin AgeeAllison Townsend

## 2017-06-04 ENCOUNTER — Encounter: Admit: 2017-06-04 | Discharge: 2017-06-04 | Payer: PRIVATE HEALTH INSURANCE | Primary: Internal Medicine

## 2017-06-05 LAB — VITAMIN D, 25 HYDROXY: VITAMIN D, 25-HYDROXY: 20.2 ng/mL — ABNORMAL LOW (ref 30.0–100.0)

## 2017-06-05 LAB — CBC WITH AUTOMATED DIFF
ABS. BASOPHILS: 0 10*3/uL (ref 0.0–0.2)
ABS. EOSINOPHILS: 0 10*3/uL (ref 0.0–0.4)
ABS. IMM. GRANS.: 0 10*3/uL (ref 0.0–0.1)
ABS. MONOCYTES: 0.3 10*3/uL (ref 0.1–0.9)
ABS. NEUTROPHILS: 1.6 10*3/uL (ref 1.4–7.0)
Abs Lymphocytes: 1 10*3/uL (ref 0.7–3.1)
BASOPHILS: 0 %
EOSINOPHILS: 1 %
HCT: 36.9 % (ref 34.0–46.6)
HGB: 12.4 g/dL (ref 11.1–15.9)
IMMATURE GRANULOCYTES: 0 %
Lymphocytes: 34 %
MCH: 30.1 pg (ref 26.6–33.0)
MCHC: 33.6 g/dL (ref 31.5–35.7)
MCV: 90 fL (ref 79–97)
MONOCYTES: 10 %
NEUTROPHILS: 55 %
PLATELET: 191 10*3/uL (ref 150–379)
RBC: 4.12 x10E6/uL (ref 3.77–5.28)
RDW: 13.1 % (ref 12.3–15.4)
WBC: 2.8 10*3/uL — ABNORMAL LOW (ref 3.4–10.8)

## 2017-06-05 LAB — METABOLIC PANEL, COMPREHENSIVE
A-G Ratio: 1.4 (ref 1.2–2.2)
ALT (SGPT): 18 IU/L (ref 0–32)
AST (SGOT): 17 IU/L (ref 0–40)
Albumin: 4 g/dL (ref 3.5–5.5)
Alk. phosphatase: 69 IU/L (ref 39–117)
BUN/Creatinine ratio: 11 (ref 9–23)
BUN: 8 mg/dL (ref 6–20)
Bilirubin, total: <0.2 mg/dL (ref 0.0–1.2)
CO2: 24 mmol/L (ref 20–29)
Calcium: 8.6 mg/dL — ABNORMAL LOW (ref 8.7–10.2)
Chloride: 103 mmol/L (ref 96–106)
Creatinine: 0.71 mg/dL (ref 0.57–1.00)
GLOBULIN, TOTAL: 2.9 g/dL (ref 1.5–4.5)
Glucose: 87 mg/dL (ref 65–99)
Potassium: 4 mmol/L (ref 3.5–5.2)
Protein, total: 6.9 g/dL (ref 6.0–8.5)
Sodium: 141 mmol/L (ref 134–144)

## 2017-06-05 LAB — LIPID PANEL
Cholesterol, total: 119 mg/dL (ref 100–199)
HDL Cholesterol: 31 mg/dL — ABNORMAL LOW (ref >39)
LDL, calculated: 68 mg/dL (ref 0–99)
Triglyceride: 99 mg/dL (ref 0–149)
VLDL, calculated: 20 mg/dL (ref 5–40)

## 2017-09-11 ENCOUNTER — Ambulatory Visit
Admit: 2017-09-11 | Discharge: 2017-09-12 | Payer: PRIVATE HEALTH INSURANCE | Attending: Internal Medicine | Primary: Internal Medicine

## 2017-09-11 DIAGNOSIS — F419 Anxiety disorder, unspecified: Secondary | ICD-10-CM

## 2017-09-11 MED ORDER — CITALOPRAM 40 MG TAB
40 mg | ORAL_TABLET | Freq: Every day | ORAL | 5 refills | Status: DC
Start: 2017-09-11 — End: 2017-10-07

## 2017-09-11 MED ORDER — BUSPIRONE 7.5 MG TAB
7.5 mg | ORAL_TABLET | Freq: Two times a day (BID) | ORAL | 5 refills | Status: DC
Start: 2017-09-11 — End: 2019-07-20

## 2017-09-11 NOTE — Progress Notes (Signed)
Rm#14    started celexa 20mg  in jan., feels like dose isnt high enough     Chief Complaint   Patient presents with   ??? Anxiety     1. Have you been to the ER, urgent care clinic since your last visit?  Hospitalized since your last visit?No    2. Have you seen or consulted any other health care providers outside of the [facility] since your last visit?  Include any pap smears or colon screening. No  There are no preventive care reminders to display for this patient.  3 most recent PHQ Screens 09/11/2017   Little interest or pleasure in doing things Not at all   Feeling down, depressed, irritable, or hopeless Not at all   Total Score PHQ 2 0

## 2017-09-11 NOTE — Progress Notes (Signed)
HPI:  Presents for f/u anxiety    Rare use of xanax  Used only for panic type sx    But, still generalized poor control of anxiety with celexa at 20 mg   Used 40 mg before.    GI sx are better with probiotic (yogurt) and diet modification.    Past medical, Social, and Family history reviewed    Prior to Admission medications    Medication Sig Start Date End Date Taking? Authorizing Provider   citalopram (CELEXA) 40 mg tablet Take 1 Tab by mouth daily. 09/11/17  Yes Mayur Duman, Sharlot Gowdaandall W, MD   busPIRone (BUSPAR) 7.5 mg tablet Take 1 Tab by mouth two (2) times a day. 09/11/17  Yes MorrisSharlot Gowda, Beautiful Pensyl W, MD   TRI-SPRINTEC, 28, 0.18/0.215/0.25 mg-35 mcg (28) tab TAKE 1 TABLET BY MOUTH EVERY DAY 04/10/17  Yes Provider, Historical   ALPRAZolam (XANAX) 0.25 mg tablet Take 1 Tab by mouth two (2) times daily as needed for Anxiety. Max Daily Amount: 0.5 mg. 05/30/17  Yes Rosemae Mcquown, Sharlot Gowdaandall W, MD   raNITIdine hcl 150 mg capsule TAKE ONE CAPSULE BY MOUTH TWICE A DAY 05/14/16   Carolan ShiverIvey, Abena T, NP   ibuprofen (MOTRIN IB) 200 mg tablet Take  by mouth.    Provider, Historical          ROS  Complete ROS reviewed and negative or stable except as noted in HPI.      Physical Exam   Constitutional: She is oriented to person, place, and time. She appears well-nourished. No distress.   HENT:   Head: Normocephalic and atraumatic.   Mouth/Throat: Oropharynx is clear and moist. No oropharyngeal exudate.   Eyes: Pupils are equal, round, and reactive to light. EOM are normal. No scleral icterus.   Neck: Normal range of motion. Neck supple. No JVD present.   Cardiovascular: Normal rate, regular rhythm and normal heart sounds. Exam reveals no gallop and no friction rub.   No murmur heard.  Pulmonary/Chest: Effort normal and breath sounds normal. No respiratory distress. She has no wheezes. She has no rales.   Abdominal: Soft. Bowel sounds are normal. She exhibits no distension. There is no tenderness.    Musculoskeletal: Normal range of motion. She exhibits no edema.   Lymphadenopathy:     She has no cervical adenopathy.   Neurological: She is alert and oriented to person, place, and time. She exhibits normal muscle tone. Coordination normal.   Skin: Skin is warm. No rash noted.   Psychiatric: She has a normal mood and affect.   Nursing note and vitals reviewed.        Prior labs reviewed.      Assessment/Plan:    ICD-10-CM ICD-9-CM    1. Anxiety F41.9 300.00 citalopram (CELEXA) 40 mg tablet   2. Seasonal allergic rhinitis, unspecified trigger J30.2 477.9    3. Gastroesophageal reflux disease without esophagitis K21.9 530.81    4. Irritable bowel syndrome with both constipation and diarrhea K58.2 564.1      Follow-up and Dispositions    ?? Return in about 2 months (around 11/11/2017), or if symptoms worsen or fail to improve, for mood.       results and schedule of future studies reviewed with patient  reviewed diet, exercise and weight   cardiovascular risk and specific lipid/LDL goals reviewed  reviewed medications and side effects in detail   Increase celexa to 40 mg   May try buspar prn  Xanax prn

## 2017-10-07 ENCOUNTER — Encounter

## 2017-10-07 NOTE — Telephone Encounter (Signed)
Medication refill request:    Last Office Visit:  09/11/17  Next Office Visit:    Future Appointments   Date Time Provider Department Center   11/11/2017 11:30 AM Etheleen Mayhew, MD [facility]       Pharmacy verified.yes  Patient requesting 90 day Supply

## 2017-10-08 NOTE — Telephone Encounter (Signed)
Changed to 90 tab R-1

## 2017-10-08 NOTE — Telephone Encounter (Signed)
Medication refill authorized.

## 2017-10-09 MED ORDER — CITALOPRAM 40 MG TAB
40 mg | ORAL_TABLET | Freq: Every day | ORAL | 1 refills | Status: DC
Start: 2017-10-09 — End: 2017-11-11

## 2017-10-19 ENCOUNTER — Encounter

## 2017-10-21 NOTE — Telephone Encounter (Signed)
Should have a 90 day  Rx  Why are they requesting 30 day  celexa?

## 2017-10-22 NOTE — Telephone Encounter (Signed)
Left message in regards to Celexa request.

## 2017-10-23 NOTE — Telephone Encounter (Signed)
Pt states she is currently taking the 40 mg of Celexa.

## 2017-11-11 ENCOUNTER — Ambulatory Visit
Admit: 2017-11-11 | Discharge: 2017-11-11 | Payer: PRIVATE HEALTH INSURANCE | Attending: Internal Medicine | Primary: Internal Medicine

## 2017-11-11 DIAGNOSIS — F419 Anxiety disorder, unspecified: Secondary | ICD-10-CM

## 2017-11-11 MED ORDER — ALPRAZOLAM 0.25 MG TAB
0.25 mg | ORAL_TABLET | Freq: Two times a day (BID) | ORAL | 0 refills | Status: DC | PRN
Start: 2017-11-11 — End: 2018-08-21

## 2017-11-11 MED ORDER — CITALOPRAM 40 MG TAB
40 mg | ORAL_TABLET | Freq: Every day | ORAL | 1 refills | Status: DC
Start: 2017-11-11 — End: 2018-10-16

## 2017-11-11 NOTE — Progress Notes (Signed)
HPI:  Presents for f/u mood    Mood improved on celexa at 40 mg     Rare use of xanax    Pt did not use buspar due to pharmacy instructions suggested that it must be used consistently rather than prn    Pt acknowledges that certain situations would be better with something additional.    Past medical, Social, and Family history reviewed    Prior to Admission medications    Medication Sig Start Date End Date Taking? Authorizing Provider   phentermine (ADIPEX-P) 37.5 mg tablet Take 37.5 mg by mouth daily. 10/08/17  Yes Provider, Historical   ALPRAZolam (XANAX) 0.25 mg tablet Take 1 Tab by mouth two (2) times daily as needed for Anxiety. Max Daily Amount: 0.5 mg. 11/11/17  Yes Ahriyah Vannest W, MD   citalopram (CELEXA) 40 mg tablet Take 1 Tab by mouth daily. 11/11/17  Yes Raidyn Breiner W, MD   TRI-SPRINTEC, 28, 0.18/0.215/0.25 mg-35 mcg (28) tab TAKE 1 TABLET BY MOUTH EVERY DAY 04/10/17  Yes Provider, Historical   busPIRone (BUSPAR) 7.5 mg tablet Take 1 Tab by mouth two (2) times a day. 09/11/17   Iviona Hole W, MD   raNITIdine hcl 150 mg capsule TAKE ONE CAPSULE BY MOUTH TWICE A DAY 05/14/16   Ivey, Abena T, NP   ibuprofen (MOTRIN IB) 200 mg tablet Take  by mouth.    Provider, Historical          ROS  Complete ROS reviewed and negative or stable except as noted in HPI.      Physical Exam   Constitutional: She is oriented to person, place, and time. She appears well-nourished. No distress.   HENT:   Head: Normocephalic and atraumatic.   Mouth/Throat: Oropharynx is clear and moist. No oropharyngeal exudate.   Eyes: Pupils are equal, round, and reactive to light. EOM are normal. No scleral icterus.   Neck: Normal range of motion. Neck supple. No JVD present.   Cardiovascular: Normal rate, regular rhythm and normal heart sounds. Exam reveals no gallop and no friction rub.   No murmur heard.  Pulmonary/Chest: Effort normal and breath sounds normal. No respiratory distress. She has no wheezes. She has no rales.    Abdominal: Soft. Bowel sounds are normal. She exhibits no distension. There is no tenderness.   Musculoskeletal: Normal range of motion. She exhibits no edema.   Lymphadenopathy:     She has no cervical adenopathy.   Neurological: She is alert and oriented to person, place, and time. She exhibits normal muscle tone. Coordination normal.   Skin: Skin is warm. No rash noted.   Psychiatric: She has a normal mood and affect.   Nursing note and vitals reviewed.        Prior labs reviewed.      Assessment/Plan:    ICD-10-CM ICD-9-CM    1. Anxiety F41.9 300.00 ALPRAZolam (XANAX) 0.25 mg tablet      citalopram (CELEXA) 40 mg tablet   2. Seasonal allergic rhinitis, unspecified trigger J30.2 477.9    3. Irritable bowel syndrome with both constipation and diarrhea K58.2 564.1      Follow-up and Dispositions    ?? Return in about 6 months (around 05/13/2018), or if symptoms worsen or fail to improve, for wellness visit.        results and schedule of future studies reviewed with patient  reviewed diet, exercise and weight  reviewed medications and side effects in detail   Counseled re: buspar use   prn rather than scheduled  Continue other current medications

## 2017-11-11 NOTE — Progress Notes (Signed)
 Rm#13    Chief Complaint   Patient presents with   . Other     2 month mood f/u      1. Have you been to the ER, urgent care clinic since your last visit?  Hospitalized since your last visit?No    2. Have you seen or consulted any other health care providers outside of the [facility] since your last visit?  Include any pap smears or colon screening. Yes 08-2017, OBGYN-lawrence miller, weight management   There are no preventive care reminders to display for this patient.    3 most recent PHQ Screens 11/11/2017   Little interest or pleasure in doing things Not at all   Feeling down, depressed, irritable, or hopeless Not at all   Total Score PHQ 2 0

## 2017-11-11 NOTE — Progress Notes (Signed)
HPI:  Presents for f/u mood    Mood improved on celexa at 40 mg     Rare use of xanax    Pt did not use buspar due to pharmacy instructions suggested that it must be used consistently rather than prn    Pt acknowledges that certain situations would be better with something additional.    Past medical, Social, and Family history reviewed    Prior to Admission medications    Medication Sig Start Date End Date Taking? Authorizing Provider   phentermine (ADIPEX-P) 37.5 mg tablet Take 37.5 mg by mouth daily. 10/08/17  Yes Provider, Historical   ALPRAZolam (XANAX) 0.25 mg tablet Take 1 Tab by mouth two (2) times daily as needed for Anxiety. Max Daily Amount: 0.5 mg. 11/11/17  Yes Raechelle Sarti, Sharlot Gowdaandall W, MD   citalopram (CELEXA) 40 mg tablet Take 1 Tab by mouth daily. 11/11/17  Yes MorrisSharlot Gowda, Sharon Stapel W, MD   TRI-SPRINTEC, 28, 0.18/0.215/0.25 mg-35 mcg (28) tab TAKE 1 TABLET BY MOUTH EVERY DAY 04/10/17  Yes Provider, Historical   busPIRone (BUSPAR) 7.5 mg tablet Take 1 Tab by mouth two (2) times a day. 09/11/17   Etheleen MayhewMorris, Yesli Vanderhoff W, MD   raNITIdine hcl 150 mg capsule TAKE ONE CAPSULE BY MOUTH TWICE A DAY 05/14/16   Carolan ShiverIvey, Abena T, NP   ibuprofen (MOTRIN IB) 200 mg tablet Take  by mouth.    Provider, Historical          ROS  Complete ROS reviewed and negative or stable except as noted in HPI.      Physical Exam   Constitutional: She is oriented to person, place, and time. She appears well-nourished. No distress.   HENT:   Head: Normocephalic and atraumatic.   Mouth/Throat: Oropharynx is clear and moist. No oropharyngeal exudate.   Eyes: Pupils are equal, round, and reactive to light. EOM are normal. No scleral icterus.   Neck: Normal range of motion. Neck supple. No JVD present.   Cardiovascular: Normal rate, regular rhythm and normal heart sounds. Exam reveals no gallop and no friction rub.   No murmur heard.  Pulmonary/Chest: Effort normal and breath sounds normal. No respiratory distress. She has no wheezes. She has no rales.    Abdominal: Soft. Bowel sounds are normal. She exhibits no distension. There is no tenderness.   Musculoskeletal: Normal range of motion. She exhibits no edema.   Lymphadenopathy:     She has no cervical adenopathy.   Neurological: She is alert and oriented to person, place, and time. She exhibits normal muscle tone. Coordination normal.   Skin: Skin is warm. No rash noted.   Psychiatric: She has a normal mood and affect.   Nursing note and vitals reviewed.        Prior labs reviewed.      Assessment/Plan:    ICD-10-CM ICD-9-CM    1. Anxiety F41.9 300.00 ALPRAZolam (XANAX) 0.25 mg tablet      citalopram (CELEXA) 40 mg tablet   2. Seasonal allergic rhinitis, unspecified trigger J30.2 477.9    3. Irritable bowel syndrome with both constipation and diarrhea K58.2 564.1      Follow-up and Dispositions    ?? Return in about 6 months (around 05/13/2018), or if symptoms worsen or fail to improve, for wellness visit.        results and schedule of future studies reviewed with patient  reviewed diet, exercise and weight  reviewed medications and side effects in detail   Counseled re: buspar use  prn rather than scheduled  Continue other current medications

## 2017-11-11 NOTE — Progress Notes (Signed)
Rm#[id number]    Chief Complaint   Patient presents with   ??? Other     2 month mood f/u      1. Have you been to the ER, urgent care clinic since your last visit?  Hospitalized since your last visit?No    2. Have you seen or consulted any other health care providers outside of the [facility] since your last visit?  Include any pap smears or colon screening. Yes 08-2017, OBGYN-lawrence miller, weight management   There are no preventive care reminders to display for this patient.    3 most recent PHQ Screens 11/11/2017   Little interest or pleasure in doing things Not at all   Feeling down, depressed, irritable, or hopeless Not at all   Total Score PHQ 2 0

## 2018-05-13 ENCOUNTER — Encounter: Attending: Internal Medicine | Primary: Internal Medicine

## 2018-08-21 ENCOUNTER — Encounter

## 2018-08-22 MED ORDER — ALPRAZOLAM 0.25 MG TAB
0.25 mg | ORAL_TABLET | Freq: Two times a day (BID) | ORAL | 0 refills | Status: DC | PRN
Start: 2018-08-22 — End: 2019-07-20

## 2018-08-22 NOTE — Telephone Encounter (Signed)
Medication refill authorized.

## 2018-08-22 NOTE — Telephone Encounter (Signed)
Patient last seen at 10/2017

## 2018-10-16 ENCOUNTER — Encounter

## 2018-10-16 MED ORDER — CITALOPRAM 40 MG TAB
40 mg | ORAL_TABLET | Freq: Every day | ORAL | 0 refills | Status: DC
Start: 2018-10-16 — End: 2019-01-12

## 2018-10-16 NOTE — Telephone Encounter (Signed)
Please contact patient for scheduling. Thanks        Last visit 11/11/2017 MD Morris   Next appointment None  Previous refill encounter(s) 11/11/2017 Celexa #90 with 1 refill    Requested Prescriptions     Pending Prescriptions Disp Refills   ??? citalopram (CELEXA) 40 mg tablet 90 Tab 0     Sig: Take 1 Tab by mouth daily.

## 2018-10-16 NOTE — Telephone Encounter (Signed)
Medication refill authorized.    Please encourage follow up for office visit appt.

## 2019-01-12 ENCOUNTER — Encounter

## 2019-01-12 MED ORDER — CITALOPRAM 40 MG TAB
40 mg | ORAL_TABLET | ORAL | 0 refills | Status: DC
Start: 2019-01-12 — End: 2019-04-09

## 2019-01-12 NOTE — Telephone Encounter (Signed)
Medication refill authorized.    Please encourage follow up for office visit appt.

## 2019-01-12 NOTE — Telephone Encounter (Signed)
Writer informed pt that her medication was approved and that Dr.Morris would like her to make a follow-up appt.Pt's husband need's to get there insurance figured out then she will make an appt at that time.

## 2019-04-09 ENCOUNTER — Encounter

## 2019-04-09 MED ORDER — CITALOPRAM 40 MG TAB
40 mg | ORAL_TABLET | ORAL | 0 refills | Status: DC
Start: 2019-04-09 — End: 2019-07-10

## 2019-04-09 NOTE — Telephone Encounter (Signed)
Medication refill authorized.    Please encourage follow up for office visit appt.

## 2019-07-10 ENCOUNTER — Encounter

## 2019-07-10 MED ORDER — CITALOPRAM 40 MG TAB
40 mg | ORAL_TABLET | ORAL | 0 refills | Status: DC
Start: 2019-07-10 — End: 2019-07-20

## 2019-07-10 NOTE — Telephone Encounter (Signed)
Writer informed pt that her medication was approved and pt is scheduled to have an IO appt on 07/20/19 @ 3:00 for her yearly physical.

## 2019-07-10 NOTE — Telephone Encounter (Signed)
Medication refill authorized.    Please encourage follow up for office visit appt.

## 2019-07-20 ENCOUNTER — Ambulatory Visit: Attending: Internal Medicine | Primary: Internal Medicine

## 2019-07-20 ENCOUNTER — Ambulatory Visit: Admit: 2019-07-20 | Payer: BLUE CROSS/BLUE SHIELD | Attending: Internal Medicine | Primary: Internal Medicine

## 2019-07-20 DIAGNOSIS — Z0000 Encounter for general adult medical examination without abnormal findings: Secondary | ICD-10-CM

## 2019-07-20 MED ORDER — CITALOPRAM 40 MG TAB
40 mg | ORAL_TABLET | ORAL | 1 refills | Status: DC
Start: 2019-07-20 — End: 2019-12-11

## 2019-07-20 MED ORDER — ALPRAZOLAM 0.25 MG TAB
0.25 mg | ORAL_TABLET | Freq: Two times a day (BID) | ORAL | 0 refills | Status: DC | PRN
Start: 2019-07-20 — End: 2021-03-07

## 2019-07-20 NOTE — Progress Notes (Signed)
Subjective:   34 y.o. female for Well Woman Check.   Her gyne and breast care is done elsewhere by her Ob-Gyne physician.    Past medical, Social, and Family history reviewed  Medications reviewed and updated.       ROS: Feeling generally well. No TIA's or unusual headaches, no dysphagia.  No prolonged cough. No dyspnea or chest pain on exertion.  No abdominal pain, change in bowel habits, black or bloody stools.  No urinary tract symptoms.  No new or unusual musculoskeletal symptoms.    Specific concerns today:     Taking celexa at 40 mg  Does feel the need for xanax at times.    Increased stress related to COVID and kids at home for school    Seeing Dr. Hyacinth Meeker and on phentermine for wt management.    Pt reports awakening with dyspnea.  No known snoring   Husband has noticed a change as well      Objective:   The patient appears well, alert, oriented x 3, in no distress.  Visit Vitals  BP 115/81 (BP 1 Location: Left upper arm, BP Patient Position: Sitting, BP Cuff Size: Large adult)   Pulse 83   Temp 97.3 ??F (36.3 ??C) (Temporal)   Resp 18   Ht 5\' 2"  (1.575 m)   Wt 169 lb 6 oz (76.8 kg)   LMP 06/28/2019   SpO2 98%   BMI 30.98 kg/m??     ENT normal.  Neck supple. No adenopathy or thyromegaly. PERLA. Lungs are clear, good air entry, no wheezes, rhonchi or rales. S1 and S2 normal, no murmurs, regular rate and rhythm. Abdomen soft without tenderness, guarding, mass or organomegaly. Extremities show no edema, normal peripheral pulses. Neurological is normal, no focal findings.  Breast and Pelvic exams are deferred.      Prior labs reviewed.      Assessment/Plan:   Well Woman  follow low fat diet, routine labs ordered, call if any problems    ICD-10-CM ICD-9-CM    1. Well woman exam (no gynecological exam)  Z00.00 V70.0 CBC WITH AUTOMATED DIFF      HEMOGLOBIN A1C WITH EAG      VITAMIN D, 25 HYDROXY      METABOLIC PANEL, COMPREHENSIVE      LIPID PANEL    [V70.0]   2. Anxiety  F41.9 300.00 citalopram (CELEXA) 40 mg tablet       ALPRAZolam (XANAX) 0.25 mg tablet   3. Sleep-disordered breathing  G47.30 780.59 SLEEP MEDICINE REFERRAL   4. Weight gain  R63.5 783.1 HEMOGLOBIN A1C WITH EAG      TSH 3RD GENERATION      T4, FREE   5. Need for hepatitis C screening test  Z11.59 V73.89 HCV AB W/RFLX TO NAA     Follow-up and Dispositions    ?? Return in about 6 months (around 01/17/2020), or if symptoms worsen or fail to improve, for sleep breathing and mood.       results and schedule of future studies reviewed with patient  reviewed diet, exercise and weight   cardiovascular risk and specific lipid/LDL goals reviewed  reviewed medications and side effects in detail     Refill meds - celexa, xanax  Ref for sleep eval to r/o OSA

## 2019-07-20 NOTE — Progress Notes (Signed)
 Progress  Notes by Vicci Burns at 07/20/19 1500                Author: Vicci Burns  Service: --  Author Type: Medical Assistant       Filed: 07/20/19 1522  Encounter Date: 07/20/2019  Status: Signed          Editor: Vicci Burns (Medical Assistant)               RM: 14        Chief Complaint       Patient presents with        ?  Well Woman             medication evaluation         Visit Vitals      BP  115/81 (BP 1 Location: Left upper arm, BP Patient Position: Sitting, BP Cuff Size: Large adult)     Pulse  83     Temp  97.3 F (36.3 C) (Temporal)     Resp  18     Ht  5' 2 (1.575 m)     Wt  169 lb 6 oz (76.8 kg)     LMP  06/28/2019     SpO2  98%        BMI  30.98 kg/m        Recent Travel Screening and Travel History documentation           Travel Screening             Question     Response            In the last month, have you been in contact with someone who was confirmed or suspected to have Coronavirus / COVID-19?    No / Unsure       Have you had a COVID-19 viral test in the last 14 days?    No       Do you have any of the following new or worsening symptoms?    None of these            Have you traveled internationally in the last month?    No             Travel History    Travel since 06/19/19           No documented travel since 06/19/19                         1. Have you been to the ER, urgent care clinic since your last visit?  Hospitalized since your last visit?No      2. Have you seen or consulted any other health care providers outside of the [facility] since your last visit?  Include any pap smears or colon screening. No          Health Maintenance Due        Topic  Date Due         ?  Hepatitis C Screening   March 26, 1986     ?  COVID-19 Vaccine (1 of 2)  08/14/2001         ?  Flu Vaccine (1)  01/27/2019               Learning Assessment  11/26/2013        PRIMARY LEARNER  Patient     BARRIERS PRIMARY LEARNER  NONE  PRIMARY LANGUAGE  ENGLISH     LEARNER  PREFERENCE PRIMARY  DEMONSTRATION     ANSWERED BY  Cameron Rake        RELATIONSHIP  SELF

## 2019-07-20 NOTE — Progress Notes (Signed)
Subjective:   34 y.o. female for Well Woman Check.   Her gyne and breast care is done elsewhere by her Ob-Gyne physician.    Past medical, Social, and Family history reviewed  Medications reviewed and updated.       ROS: Feeling generally well. No TIA's or unusual headaches, no dysphagia.  No prolonged cough. No dyspnea or chest pain on exertion.  No abdominal pain, change in bowel habits, black or bloody stools.  No urinary tract symptoms.  No new or unusual musculoskeletal symptoms.    Specific concerns today:     Taking celexa at 40 mg  Does feel the need for xanax at times.    Increased stress related to COVID and kids at home for school    Seeing Dr. Hyacinth Meeker and on phentermine for wt management.    Pt reports awakening with dyspnea.  No known snoring   Husband has noticed a change as well      Objective:   The patient appears well, alert, oriented x 3, in no distress.  Visit Vitals  BP 115/81 (BP 1 Location: Left upper arm, BP Patient Position: Sitting, BP Cuff Size: Large adult)   Pulse 83   Temp 97.3 ??F (36.3 ??C) (Temporal)   Resp 18   Ht 5\' 2"  (1.575 m)   Wt 169 lb 6 oz (76.8 kg)   LMP 06/28/2019   SpO2 98%   BMI 30.98 kg/m??     ENT normal.  Neck supple. No adenopathy or thyromegaly. PERLA. Lungs are clear, good air entry, no wheezes, rhonchi or rales. S1 and S2 normal, no murmurs, regular rate and rhythm. Abdomen soft without tenderness, guarding, mass or organomegaly. Extremities show no edema, normal peripheral pulses. Neurological is normal, no focal findings.  Breast and Pelvic exams are deferred.      Prior labs reviewed.      Assessment/Plan:   Well Woman  follow low fat diet, routine labs ordered, call if any problems    ICD-10-CM ICD-9-CM    1. Well woman exam (no gynecological exam)  Z00.00 V70.0 CBC WITH AUTOMATED DIFF      HEMOGLOBIN A1C WITH EAG      VITAMIN D, 25 HYDROXY      METABOLIC PANEL, COMPREHENSIVE      LIPID PANEL    [V70.0]    2. Anxiety  F41.9 300.00 citalopram (CELEXA) 40 mg tablet      ALPRAZolam (XANAX) 0.25 mg tablet   3. Sleep-disordered breathing  G47.30 780.59 SLEEP MEDICINE REFERRAL   4. Weight gain  R63.5 783.1 HEMOGLOBIN A1C WITH EAG      TSH 3RD GENERATION      T4, FREE   5. Need for hepatitis C screening test  Z11.59 V73.89 HCV AB W/RFLX TO NAA     Follow-up and Dispositions    ?? Return in about 6 months (around 01/17/2020), or if symptoms worsen or fail to improve, for sleep breathing and mood.       results and schedule of future studies reviewed with patient  reviewed diet, exercise and weight   cardiovascular risk and specific lipid/LDL goals reviewed  reviewed medications and side effects in detail     Refill meds - celexa, xanax  Ref for sleep eval to r/o OSA

## 2019-07-20 NOTE — Progress Notes (Signed)
RM: 14    Chief Complaint   Patient presents with   ??? Well Woman     medication evaluation      Visit Vitals  BP 115/81 (BP 1 Location: Left upper arm, BP Patient Position: Sitting, BP Cuff Size: Large adult)   Pulse 83   Temp 97.3 ??F (36.3 ??C) (Temporal)   Resp 18   Ht 5\' 2"  (1.575 m)   Wt 169 lb 6 oz (76.8 kg)   LMP 06/28/2019   SpO2 98%   BMI 30.98 kg/m??     Recent Travel Screening and Travel History documentation     Travel Screening     Question   Response    In the last month, have you been in contact with someone who was confirmed or suspected to have Coronavirus / COVID-19?  No / Unsure    Have you had a COVID-19 viral test in the last 14 days?  No    Do you have any of the following new or worsening symptoms?  None of these    Have you traveled internationally in the last month?  No      Travel History   Travel since 06/19/19     No documented travel since 06/19/19               1. Have you been to the ER, urgent care clinic since your last visit?  Hospitalized since your last visit?No    2. Have you seen or consulted any other health care providers outside of the [facility] since your last visit?  Include any pap smears or colon screening. No     Health Maintenance Due   Topic Date Due   ??? Hepatitis C Screening  May 12, 1986   ??? COVID-19 Vaccine (1 of 2) 08/14/2001   ??? Flu Vaccine (1) 01/27/2019        Learning Assessment 11/26/2013   PRIMARY LEARNER Patient   BARRIERS PRIMARY LEARNER NONE   PRIMARY LANGUAGE ENGLISH   LEARNER PREFERENCE PRIMARY DEMONSTRATION   ANSWERED BY 01/27/2014   RELATIONSHIP SELF

## 2019-07-30 ENCOUNTER — Other Ambulatory Visit: Admit: 2019-07-30 | Payer: BLUE CROSS/BLUE SHIELD | Primary: Internal Medicine

## 2019-07-30 DIAGNOSIS — Z1159 Encounter for screening for other viral diseases: Secondary | ICD-10-CM

## 2019-07-30 LAB — LIPID PANEL
CHOL/HDL Ratio: 4 (ref 0.0–5.0)
Chol/HDL Ratio: 4 (ref 0.0–5.0)
Cholesterol, Total: 156 MG/DL (ref <200)
Cholesterol, total: 156 MG/DL (ref <200)
HDL Cholesterol: 39 MG/DL
HDL: 39 MG/DL
LDL Calculated: 88.2 MG/DL (ref 0–100)
LDL, calculated: 88.2 MG/DL (ref 0–100)
Triglyceride: 144 MG/DL (ref <150)
Triglycerides: 144 MG/DL (ref <150)
VLDL Cholesterol Calculated: 28.8 MG/DL
VLDL, calculated: 28.8 MG/DL

## 2019-07-30 LAB — CBC WITH AUTO DIFFERENTIAL
Basophils %: 0 % (ref 0–1)
Basophils Absolute: 0 10*3/uL (ref 0.0–0.1)
Eosinophils %: 1 % (ref 0–7)
Eosinophils Absolute: 0.1 10*3/uL (ref 0.0–0.4)
Granulocyte Absolute Count: 0 10*3/uL (ref 0.00–0.04)
Hematocrit: 36.7 % (ref 35.0–47.0)
Hemoglobin: 12.3 g/dL (ref 11.5–16.0)
Immature Granulocytes: 0 % (ref 0.0–0.5)
Lymphocytes %: 27 % (ref 12–49)
Lymphocytes Absolute: 1.8 10*3/uL (ref 0.8–3.5)
MCH: 30.4 PG (ref 26.0–34.0)
MCHC: 33.5 g/dL (ref 30.0–36.5)
MCV: 90.6 FL (ref 80.0–99.0)
MPV: 11.3 FL (ref 8.9–12.9)
Monocytes %: 7 % (ref 5–13)
Monocytes Absolute: 0.4 10*3/uL (ref 0.0–1.0)
NRBC Absolute: 0 10*3/uL (ref 0.00–0.01)
Neutrophils %: 65 % (ref 32–75)
Neutrophils Absolute: 4.3 10*3/uL (ref 1.8–8.0)
Nucleated RBCs: 0 PER 100 WBC
Platelets: 221 10*3/uL (ref 150–400)
RBC: 4.05 M/uL (ref 3.80–5.20)
RDW: 11.9 % (ref 11.5–14.5)
WBC: 6.6 10*3/uL (ref 3.6–11.0)

## 2019-07-30 LAB — HEMOGLOBIN A1C W/EAG
Hemoglobin A1C: 5.5 % (ref 4.0–5.6)
eAG: 111 mg/dL

## 2019-07-30 LAB — COMPREHENSIVE METABOLIC PANEL
ALT: 44 U/L (ref 12–78)
AST: 14 U/L — ABNORMAL LOW (ref 15–37)
Albumin/Globulin Ratio: 1.1 (ref 1.1–2.2)
Albumin: 4 g/dL (ref 3.5–5.0)
Alkaline Phosphatase: 92 U/L (ref 45–117)
Anion Gap: 9 mmol/L (ref 5–15)
BUN: 10 MG/DL (ref 6–20)
Bun/Cre Ratio: 13 (ref 12–20)
CO2: 26 mmol/L (ref 21–32)
Calcium: 9.3 MG/DL (ref 8.5–10.1)
Chloride: 104 mmol/L (ref 97–108)
Creatinine: 0.78 MG/DL (ref 0.55–1.02)
EGFR IF NonAfrican American: >60 mL/min/{1.73_m2} (ref >60)
GFR African American: >60 mL/min/{1.73_m2} (ref >60)
Globulin: 3.7 g/dL (ref 2.0–4.0)
Glucose: 95 mg/dL (ref 65–100)
Potassium: 3.6 mmol/L (ref 3.5–5.1)
Sodium: 139 mmol/L (ref 136–145)
Total Bilirubin: 0.5 MG/DL (ref 0.2–1.0)
Total Protein: 7.7 g/dL (ref 6.4–8.2)

## 2019-07-30 LAB — TSH 3RD GENERATION
TSH: 1.03 u[IU]/mL (ref 0.36–3.74)
TSH: 1.03 u[IU]/mL (ref 0.36–3.74)

## 2019-07-30 LAB — T4, FREE
T4 Free: 1 NG/DL (ref 0.8–1.5)
T4, Free: 1 NG/DL (ref 0.8–1.5)

## 2019-07-30 LAB — VITAMIN D 25 HYDROXY: Vit D, 25-Hydroxy: 20.2 ng/mL — ABNORMAL LOW (ref 30–100)

## 2019-07-30 LAB — METABOLIC PANEL, COMPREHENSIVE
A-G Ratio: 1.1 (ref 1.1–2.2)
ALT (SGPT): 44 U/L (ref 12–78)
AST (SGOT): 14 U/L — ABNORMAL LOW (ref 15–37)
Albumin: 4 g/dL (ref 3.5–5.0)
Alk. phosphatase: 92 U/L (ref 45–117)
Anion gap: 9 mmol/L (ref 5–15)
BUN/Creatinine ratio: 13 (ref 12–20)
BUN: 10 MG/DL (ref 6–20)
Bilirubin, total: 0.5 MG/DL (ref 0.2–1.0)
CO2: 26 mmol/L (ref 21–32)
Calcium: 9.3 MG/DL (ref 8.5–10.1)
Chloride: 104 mmol/L (ref 97–108)
Creatinine: 0.78 MG/DL (ref 0.55–1.02)
GFR est AA: >60 mL/min/{1.73_m2} (ref >60)
GFR est non-AA: >60 mL/min/{1.73_m2} (ref >60)
Globulin: 3.7 g/dL (ref 2.0–4.0)
Glucose: 95 mg/dL (ref 65–100)
Potassium: 3.6 mmol/L (ref 3.5–5.1)
Protein, total: 7.7 g/dL (ref 6.4–8.2)
Sodium: 139 mmol/L (ref 136–145)

## 2019-07-30 LAB — CBC WITH AUTOMATED DIFF
ABS. BASOPHILS: 0 10*3/uL (ref 0.0–0.1)
ABS. EOSINOPHILS: 0.1 10*3/uL (ref 0.0–0.4)
ABS. IMM. GRANS.: 0 10*3/uL (ref 0.00–0.04)
ABS. LYMPHOCYTES: 1.8 10*3/uL (ref 0.8–3.5)
ABS. MONOCYTES: 0.4 10*3/uL (ref 0.0–1.0)
ABS. NEUTROPHILS: 4.3 10*3/uL (ref 1.8–8.0)
ABSOLUTE NRBC: 0 10*3/uL (ref 0.00–0.01)
BASOPHILS: 0 % (ref 0–1)
EOSINOPHILS: 1 % (ref 0–7)
HCT: 36.7 % (ref 35.0–47.0)
HGB: 12.3 g/dL (ref 11.5–16.0)
IMMATURE GRANULOCYTES: 0 % (ref 0.0–0.5)
LYMPHOCYTES: 27 % (ref 12–49)
MCH: 30.4 PG (ref 26.0–34.0)
MCHC: 33.5 g/dL (ref 30.0–36.5)
MCV: 90.6 FL (ref 80.0–99.0)
MONOCYTES: 7 % (ref 5–13)
MPV: 11.3 FL (ref 8.9–12.9)
NEUTROPHILS: 65 % (ref 32–75)
NRBC: 0 PER 100 WBC
PLATELET: 221 10*3/uL (ref 150–400)
RBC: 4.05 M/uL (ref 3.80–5.20)
RDW: 11.9 % (ref 11.5–14.5)
WBC: 6.6 10*3/uL (ref 3.6–11.0)

## 2019-07-30 LAB — VITAMIN D, 25 HYDROXY: Vitamin D 25-Hydroxy: 20.2 ng/mL — ABNORMAL LOW (ref 30–100)

## 2019-07-30 LAB — HEMOGLOBIN A1C WITH EAG
Est. average glucose: 111 mg/dL
Hemoglobin A1c: 5.5 % (ref 4.0–5.6)

## 2019-07-30 NOTE — Progress Notes (Signed)
Vitamin D is low - take an additional 1000 units of vitamin D daily.  Other labs are either normal or stable and at goal.  Keep up the good work!   Continue your current medications

## 2019-07-30 NOTE — Progress Notes (Signed)
Vitamin D is low - take an additional 1000 units of vitamin D daily.  Other labs are either normal or stable and at goal.  Keep up the good work!   Continue your current medications

## 2019-07-31 LAB — HCV AB W/RFLX TO NAA
HCV AB, 144035: <0.1 s/co ratio (ref 0.0–0.9)
HCV Ab: <0.1 s/co ratio (ref 0.0–0.9)

## 2019-07-31 LAB — HCV INTERPRETATION

## 2019-12-11 ENCOUNTER — Encounter

## 2019-12-11 MED ORDER — CITALOPRAM 40 MG TAB
40 mg | ORAL_TABLET | Freq: Every day | ORAL | 1 refills | Status: DC
Start: 2019-12-11 — End: 2020-07-13

## 2019-12-11 NOTE — Telephone Encounter (Signed)
 Pt left voice message requesting a refill.     Last visit 07/20/2019 MD Morris   Next appointment 01/18/2020 MD Morris   Previous refill encounter(s)   07/20/2019 Celexa  #90 with 1 refill      Requested Prescriptions     Pending Prescriptions Disp Refills   . citalopram  (CELEXA ) 40 mg tablet 90 Tablet 0     Sig: Take 1 Tablet by mouth daily.

## 2019-12-11 NOTE — Telephone Encounter (Signed)
Medication refill authorized.

## 2020-01-18 ENCOUNTER — Ambulatory Visit: Attending: Internal Medicine | Primary: Internal Medicine

## 2020-01-18 ENCOUNTER — Ambulatory Visit: Admit: 2020-01-18 | Payer: BLUE CROSS/BLUE SHIELD | Attending: Internal Medicine | Primary: Internal Medicine

## 2020-01-18 DIAGNOSIS — F419 Anxiety disorder, unspecified: Secondary | ICD-10-CM

## 2020-01-18 LAB — AMB POC URINALYSIS DIP STICK AUTO W/O MICRO
Bilirubin (UA POC): NEGATIVE
Bilirubin, Urine, POC: NEGATIVE
Blood (UA POC): NEGATIVE
Blood (UA POC): NEGATIVE
Glucose (UA POC): NEGATIVE
Glucose, Urine, POC: NEGATIVE
Ketones (UA POC): NEGATIVE
Ketones, Urine, POC: NEGATIVE
Leukocyte Esterase, Urine, POC: NEGATIVE
Leukocyte esterase (UA POC): NEGATIVE
Nitrite, Urine, POC: NEGATIVE
Nitrites (UA POC): NEGATIVE
Protein (UA POC): NEGATIVE
Protein, Urine, POC: NEGATIVE
Specific Gravity, Urine, POC: 1.03 NA (ref 1.001–1.035)
Specific gravity (UA POC): 1.03 (ref 1.001–1.035)
Urobilinogen (UA POC): 0.2 (ref 0.2–1)
Urobilinogen, POC: 0.2 (ref 0.2–1)
pH (UA POC): 5.5 (ref 4.6–8.0)
pH, Urine, POC: 5.5 NA (ref 4.6–8.0)

## 2020-01-18 MED ORDER — TRIMETHOPRIM-SULFAMETHOXAZOLE 160 MG-800 MG TAB
160-800 mg | ORAL_TABLET | Freq: Two times a day (BID) | ORAL | 0 refills | Status: AC
Start: 2020-01-18 — End: 2020-01-21

## 2020-01-18 NOTE — Progress Notes (Signed)
HPI:  established patient  Presents for f/u urinary sx, mood, sleep    Doing well on celexa  Short period off med due to issue with pharmacy  Pt found she definitely noticed improvement on med    No sleep eval yet    Dysuria and urinary frequency    Past medical, Social, and Family history reviewed    Prior to Admission medications    Medication Sig Start Date End Date Taking? Authorizing Provider   citalopram (CELEXA) 40 mg tablet Take 1 Tablet by mouth daily. 12/11/19  Yes Josimar Corning, Sharlot Gowda, MD   ALPRAZolam Prudy Feeler) 0.25 mg tablet Take 1 Tab by mouth two (2) times daily as needed for Anxiety. Max Daily Amount: 0.5 mg. 07/20/19  Yes Etheleen Mayhew, MD   phentermine (ADIPEX-P) 37.5 mg tablet Take 37.5 mg by mouth daily. 10/08/17  Yes Provider, Historical   ibuprofen (MOTRIN IB) 200 mg tablet Take  by mouth.   Yes Provider, Historical          ROS  Complete ROS reviewed and negative or stable except as noted in HPI.      Physical Exam  Vitals and nursing note reviewed.   Constitutional:       General: She is not in acute distress.  HENT:      Head: Normocephalic and atraumatic.      Mouth/Throat:      Pharynx: No oropharyngeal exudate.   Eyes:      General: No scleral icterus.     Pupils: Pupils are equal, round, and reactive to light.   Neck:      Vascular: No JVD.   Cardiovascular:      Rate and Rhythm: Normal rate and regular rhythm.      Heart sounds: Normal heart sounds. No murmur heard.   No friction rub. No gallop.    Pulmonary:      Effort: Pulmonary effort is normal. No respiratory distress.      Breath sounds: Normal breath sounds. No wheezing or rales.   Abdominal:      General: Bowel sounds are normal. There is no distension.      Palpations: Abdomen is soft.      Tenderness: There is no abdominal tenderness.   Musculoskeletal:         General: Normal range of motion.      Cervical back: Normal range of motion and neck supple.   Lymphadenopathy:      Cervical: No cervical adenopathy.   Skin:     General:  Skin is warm.      Findings: No rash.   Neurological:      Mental Status: She is alert and oriented to person, place, and time.      Motor: No abnormal muscle tone.      Coordination: Coordination normal.           Prior labs reviewed.      Assessment/Plan:    ICD-10-CM ICD-9-CM    1. Anxiety  F41.9 300.00    2. Urinary frequency  R35.0 788.41 URINALYSIS W/ RFLX MICROSCOPIC      AMB POC URINALYSIS DIP STICK AUTO W/O MICRO      trimethoprim-sulfamethoxazole (BACTRIM DS, SEPTRA DS) 160-800 mg per tablet      URINALYSIS W/ RFLX MICROSCOPIC      CANCELED: CULTURE, URINE      CANCELED: CULTURE, URINE   3. Sleep-disordered breathing  G47.30 780.59 SLEEP MEDICINE REFERRAL   4. Seasonal allergic rhinitis,  unspecified trigger  J30.2 477.9      Follow-up and Dispositions    ?? Return in about 6 months (around 07/20/2020), or if symptoms worsen or fail to improve.       results and schedule of future studies reviewed with patient  reviewed diet, exercise and weight   cardiovascular risk and specific lipid/LDL goals reviewed  reviewed medications and side effects in detail    Continue current medications   Empiric bactrim for UTI  Follow up urine cx  Re-ref to sleep    An electronic signature was used to authenticate this note.  -- Etheleen Mayhew, MD

## 2020-01-18 NOTE — Progress Notes (Signed)
No growth from the urine culture, so unable to confirm bladder infection.  If you feel better following the antibiotic, good.  If your symptoms persist, then a urologist would need to evaluate you to clarify why you may be having such symptoms in the absence of infection.

## 2020-01-18 NOTE — Progress Notes (Signed)
 Progress  Notes by Vicci Burns at 01/18/20 1400                Author: Vicci Burns  Service: --  Author Type: Medical Assistant       Filed: 01/18/20 1418  Encounter Date: 01/18/2020  Status: Signed          Editor: Vicci Burns (Medical Assistant)               RM: 14   Patient is not fasting        Chief Complaint       Patient presents with        ?  Sleep Problem             6 month f/u Patient presents with concerns os possible UTI        ?  Breathing Problem         Visit Vitals      BP  134/80 (BP 1 Location: Left upper arm, BP Patient Position: Sitting, BP Cuff Size: Adult)     Pulse  (!) 107     Temp  98.6 F (37 C) (Oral)     Resp  20     Ht  5' 2 (1.575 m)     Wt  177 lb 8 oz (80.5 kg)     LMP  01/07/2020 (Approximate)     SpO2  98%        BMI  32.47 kg/m        Recent Travel Screening and Travel History documentation           Travel Screening             Question     Response            In the last month, have you been in contact with someone who was confirmed or suspected to have Coronavirus / COVID-19?    No / Unsure       Have you had a COVID-19 viral test in the last 14 days?    No       Do you have any of the following new or worsening symptoms?    None of these            Have you traveled internationally or domestically in the last month?    No             Travel History    Travel since 12/18/19           No documented travel since 12/18/19                                  3 most recent PHQ Screens  01/18/2020        Little interest or pleasure in doing things  Not at all     Feeling down, depressed, irritable, or hopeless  Not at all        Total Score PHQ 2  0                  1. Have you been to the ER, urgent care clinic since your last visit?  Hospitalized since your last visit?No      2. Have you seen or consulted any other health care providers outside of the [facility] since your last visit?  Include any pap smears or colon screening. No  Health Maintenance Due        Topic  Date Due         ?  COVID-19 Vaccine (1)  Never done         ?  PAP AKA CERVICAL CYTOLOGY   02/26/2020               Learning Assessment  11/26/2013        PRIMARY LEARNER  Patient     BARRIERS PRIMARY LEARNER  NONE     PRIMARY LANGUAGE  ENGLISH     LEARNER PREFERENCE PRIMARY  DEMONSTRATION     ANSWERED BY  Cameron Rake        RELATIONSHIP  SELF

## 2020-01-20 LAB — URINALYSIS W/ RFLX MICROSCOPIC
Bilirubin, Urine: NEGATIVE
Bilirubin: NEGATIVE
Blood, Urine: NEGATIVE
Blood: NEGATIVE
Glucose, UA: NEGATIVE
Glucose: NEGATIVE
Ketone: NEGATIVE
Ketones, Urine: NEGATIVE
Leukocyte Esterase, Urine: NEGATIVE
Leukocyte Esterase: NEGATIVE
Nitrite, Urine: NEGATIVE
Nitrites: NEGATIVE
Protein, UA: NEGATIVE
Protein: NEGATIVE
Specific Gravity, UA: 1.018 NA (ref 1.005–1.030)
Specific Gravity: 1.018 (ref 1.005–1.030)
Urobilinogen, Urine: 0.2 mg/dL (ref 0.2–1.0)
Urobilinogen: 0.2 mg/dL (ref 0.2–1.0)
pH (UA): 5 (ref 5.0–7.5)
pH, UA: 5 NA (ref 5.0–7.5)

## 2020-01-20 LAB — CULTURE, URINE
Urine Culture, Routine: NO GROWTH
Urine Culture, Routine: NO GROWTH

## 2020-01-20 NOTE — Telephone Encounter (Signed)
Phoned the patient to schedule a sleep consult per Etheleen Mayhew, MD.  Patient's husband stated that their son was having an appendectomy, and that she would call back to schedule.

## 2020-02-17 ENCOUNTER — Telehealth: Attending: Specialist | Primary: Internal Medicine

## 2020-02-17 ENCOUNTER — Telehealth
Admit: 2020-02-17 | Discharge: 2020-02-17 | Payer: BLUE CROSS/BLUE SHIELD | Attending: Specialist | Primary: Internal Medicine

## 2020-02-17 DIAGNOSIS — G4733 Obstructive sleep apnea (adult) (pediatric): Secondary | ICD-10-CM

## 2020-02-17 NOTE — Progress Notes (Signed)
Progress Notes by Charlestine Night, MD at 02/17/20 0920                Author: Charlestine Night, MD  Service: --  Author Type: Physician       Filed: 02/17/20 0945  Encounter Date: 02/17/2020  Status: Signed          Editor: Charlestine Night, MD (Physician)                                             [address] [place], [place] [address]   Tel.  [phone]   Fax. [phone]  [address]   [place], [place] [address]   Tel.  [phone]   Fax. [phone]  [address]   [place], [place] [address]   Tel.  [phone]   Fax. [phone]        Erin Townsend is a 34 y.o.  female who was seen by synchronous (real-time) audio-video technology on 02/17/2020.        Consent:   She and/or her healthcare decision  maker is aware that this patient-initiated Telehealth encounter is a billable service, with coverage as determined by her insurance carrier.  She is aware that she may receive a bill and has provided verbal consent to proceed:  Yes      I was in the office while conducting this encounter.     Chief Complaint          No chief complaint on file.           HPI         Erin Townsend is 34 y.o.  female seen for evaluation of a sleep disorder.  Patient has a history of snoring and nocturnal  awakening.  Normally retires at 11: 30 p.m. and will get out of bed with an alarm at 6: 30 AM.  She may awaken several times during the night.      He has been told of snoring described as loud at times; associated with awakenings "gasping for air".  She may be fatigued on awakening and may nap in the mid afternoon for 30 to 40 minutes.  She does feel better following the nap.      She denies vivid dreaming or nightmares, sleep talking or sleepwalking, bruxism or nocturnal incontinence, abnormal arm or leg movements, hypnagogic hallucinations, sleep paralysis or cataplexy.         The patient has not undergone diagnostic testing for the current problems.             Epworth Sleepiness  Score: (P) 7           Allergies        Allergen  Reactions         ?  Pcn [Penicillins]  Rash             Current Outpatient Medications          Medication  Sig  Dispense  Refill           ?  citalopram (CELEXA) 40 mg tablet  Take 1 Tablet by mouth daily.  90 Tablet  1     ?  ALPRAZolam (XANAX) 0.25 mg tablet  Take 1 Tab by mouth two (2) times daily as needed for Anxiety. Max Daily Amount: 0.5 mg.  15 Tab  0     ?  phentermine (ADIPEX-P) 37.5 mg tablet  Take 37.5 mg by mouth daily.    0           ?  ibuprofen (MOTRIN IB) 200 mg tablet  Take  by mouth.                She  has a past medical history of Allergic rhinitis, Anxiety (09/26/2009), Depression  with anxiety, Gallstones, GERD (gastroesophageal reflux disease), Gyn exam, and IBS (irritable bowel syndrome). She also has no past medical history of Adverse effect of anesthesia.      She  has a past surgical history that includes hx wisdom teeth extraction; hx heent;  and hx lap cholecystectomy (04/25/2016).      She family history includes Cancer in her mother; Hypertension in her father and mother.      She  reports that she has never smoked. She has never used smokeless tobacco. She reports  current alcohol use. She reports that she does not use drugs.       Review of Systems:   Review of Systems    Constitutional: Negative for chills and fever.    HENT: Negative for hearing loss and tinnitus.     Eyes: Negative for blurred vision and double vision.    Respiratory: Negative for cough and shortness of breath.     Cardiovascular: Negative for chest pain and palpitations.    Gastrointestinal: Negative for abdominal pain and heartburn.    Genitourinary: Negative for frequency and urgency.    Musculoskeletal: Negative for back pain and neck pain.    Skin: Negative for itching and rash.    Neurological: Negative for dizziness and headaches.    Psychiatric/Behavioral: Negative for depression. The patient is not nervous/anxious.        Due to this being a telemedicine  evaluation, certain elements of the physical examination are unable to be assessed.        Objective:        Weight: 177 lb   BMI: 32.47         General:    Conversant, cooperative     Eyes:    no nystagmus     Oropharynx:    Mallampati score III, tongue scalloped                                       Skin:  no obvious rashes     Neuro:   Speech fluent, face symmetrical, tongue movement normal        Psych:   Normal affect,  normal countenance              Assessment:                  ICD-10-CM  ICD-9-CM             1.  OSA (obstructive sleep apnea)   G47.33  327.23  SLEEP STUDY UNATTENDED, 4 CHANNEL           History of snoring and witnessed apnea consistent with sleep disordered breathing.  Patient does have a long soft palate/large tongue.  Potentially, sleep disordered breathing more evident when supine, and/or in rem sleep.      She will be evaluated with a home sleep test.  Results to be reviewed.           Plan:  Orders Placed This Encounter        ?  SLEEP STUDY UNATTENDED, 4 CHANNEL              Order Specific Question:    Reason for Exam              Answer:    snoring           * Patient has a history and examination consistent with the diagnosis of sleep apnea.   *Home sleep testing was ordered for initial evaluation.     * She was provided information on sleep apnea including corresponding risk factors and the importance of proper treatment.    * Treatment options if indicated were reviewed today.        Instructions:   o  The patient would benefit from weight reduction measures.   o  Do not engage in activities requiring a normal degree of alertness if fatigue is present.   o  The patient understands that untreated or undertreated sleep apnea could impair judgement and the ability to function normally during the day.   o  Call or return if symptoms worsen or persist.               [name], MD, FAASM   Electronically signed 02/17/20       Pursuant to the emergency declaration under  the [facility] and the [employer], 1135 waiver authority and the [employer] Act,  this Virtual  Visit was conducted, with patient's consent, to reduce the patient's risk of exposure to COVID-19 and provide continuity of care for an established patient.       Services were provided through a video synchronous discussion virtually to substitute for in-person clinic visit.      Charlestine Night, MD       This note was created using voice recognition software. Despite editing, there may be syntax errors.  This note will not be viewable  in MyChart.

## 2020-02-22 ENCOUNTER — Ambulatory Visit: Primary: Internal Medicine

## 2020-02-22 ENCOUNTER — Inpatient Hospital Stay: Admit: 2020-03-07 | Payer: BLUE CROSS/BLUE SHIELD | Primary: Internal Medicine

## 2020-02-22 ENCOUNTER — Ambulatory Visit: Admit: 2020-02-22 | Discharge: 2020-02-22 | Primary: Internal Medicine

## 2020-02-22 DIAGNOSIS — G4733 Obstructive sleep apnea (adult) (pediatric): Secondary | ICD-10-CM

## 2020-02-22 NOTE — Progress Notes (Signed)
 Progress  Notes by Dyane Heron Caldron at 02/22/20 1030                Author: Dyane Heron Caldron  Service: --  Author Type: Technologist       Filed: 02/22/20 1054  Encounter Date: 02/22/2020  Status: Signed          Editor: Dyane Heron Caldron (Technologist)                                            [address] [place], [place] [address]   Tel.  [phone]   Fax. [phone]  [address]   [place], [place] [address]   Tel.  [phone]   Fax. [phone]  [address]   [place], [place] [address]   Tel.  [phone]   Fax. [phone]           S>Erin Townsend is a  34 y.o. female seen today to receive a home sleep testing unit (HST).        Patient was educated on proper hookup and operation of the HST.     Instruction forms and documentation were reviewed and signed.     The patient demonstrated good understanding of the HST.      O>     There were no vitals taken for this visit.        A>   No diagnosis found.         P>     General information regarding operations and maintenance of the device was provided.     She was provided information on sleep apnea including coresponding risk factors and the importance of proper treatment.      Follow-up appointment was made to return the HST. She will be contacted once the results have been reviewed.     She was asked to contact our office for any problems regarding her  home sleep test study.    HSAT SN # [id number]                             [address] [place], [place] [address]   Tel.  [phone]   Fax. [phone]  [address]   [place], [place] [address]   Tel.  [phone]   Fax. [phone]  [address]   [place], [place] [address]   Tel.  [phone]   Fax. [phone]           S>Erin Townsend is a  34 y.o. female seen today to receive a home sleep testing unit (HST).      Patient was educated on proper hookup and operation of the HST.   Instruction forms and documentation were reviewed  and signed.   The patient demonstrated good understanding of the HST.      O>     There were no vitals taken for this visit.        A>   No diagnosis found.         P>   General information regarding operations and maintenance of the device was provided.   She was provided information on sleep apnea including coresponding risk factors and the importance of proper treatment.    Follow-up appointment was made to return the HST.  She will be contacted once the results have been reviewed.   She was asked to contact our office for any problems regarding her  home sleep test study.

## 2020-02-23 ENCOUNTER — Ambulatory Visit: Admit: 2020-02-23 | Discharge: 2020-02-23 | Primary: Internal Medicine

## 2020-03-07 ENCOUNTER — Telehealth

## 2020-03-07 NOTE — Telephone Encounter (Signed)
HSAT performed for potential sleep disordered breathing.  Study demonstrated normal AHI of 2.4/h associated with minimal SaO2 87%.  Snoring during 32.1% of the recording.    Impression: HSAT potentially underestimated severity of sleep disordered breathing.    Recommendation: Attended polysomnogram    Sleep technologist: Please advise patient of HSAT results.

## 2020-03-11 NOTE — Telephone Encounter (Signed)
Left voicemail to review sleep study results.

## 2020-03-16 NOTE — Telephone Encounter (Signed)
Reviewed sleep study results with patient. She expressed understanding and is willing to proceed with a PSG @ [facility].

## 2020-03-17 NOTE — Telephone Encounter (Signed)
DONE

## 2020-04-24 ENCOUNTER — Ambulatory Visit: Payer: BLUE CROSS/BLUE SHIELD | Primary: Internal Medicine

## 2020-07-13 ENCOUNTER — Encounter

## 2020-07-13 MED ORDER — CITALOPRAM 40 MG TAB
40 mg | ORAL_TABLET | ORAL | 1 refills | Status: DC
Start: 2020-07-13 — End: 2021-01-25

## 2020-07-13 NOTE — Telephone Encounter (Signed)
Medication refill authorized.

## 2020-11-23 ENCOUNTER — Encounter: Payer: BLUE CROSS/BLUE SHIELD | Attending: Internal Medicine | Primary: Internal Medicine

## 2021-01-15 ENCOUNTER — Encounter

## 2021-01-25 MED ORDER — CITALOPRAM 40 MG TAB
40 mg | ORAL_TABLET | ORAL | 0 refills | Status: DC
Start: 2021-01-25 — End: 2021-03-07

## 2021-01-25 NOTE — Telephone Encounter (Signed)
Pt has a scheduled office visit on 03/07/2021 with Dr. Karen Chafe. Can pt have a refill until seen by new provider? Thank you.       Pt left a voice message requesting a refill.     [phone] [id number]    Last visit 01/18/2020 MD Morris   Next appointment 03/07/2021 MD Azucena Cecil   Previous refill encounter(s)   07/23/2020 Celexa #90 with 1 refill.     For Pharmacy Admin Tracking Only    Intervention Detail: New Rx: 1, reason: Patient Preference  Time Spent (min): 5      Requested Prescriptions     Pending Prescriptions Disp Refills    citalopram (CELEXA) 40 mg tablet [Pharmacy Med Name: Citalopram Hydrobromide Oral Tablet 40 MG] 90 Tablet 0     Sig: TAKE 1 TABLET BY MOUTH ONE TIME DAILY

## 2021-03-07 ENCOUNTER — Ambulatory Visit: Attending: Family Medicine | Primary: Internal Medicine

## 2021-03-07 ENCOUNTER — Ambulatory Visit: Admit: 2021-03-07 | Payer: BLUE CROSS/BLUE SHIELD | Attending: Family Medicine | Primary: Internal Medicine

## 2021-03-07 DIAGNOSIS — J040 Acute laryngitis: Secondary | ICD-10-CM

## 2021-03-07 MED ORDER — ALPRAZOLAM 0.5 MG TAB
0.5 mg | ORAL_TABLET | Freq: Two times a day (BID) | ORAL | 0 refills | Status: AC | PRN
Start: 2021-03-07 — End: ?

## 2021-03-07 MED ORDER — AZITHROMYCIN 250 MG TAB
250 mg | ORAL_TABLET | ORAL | 0 refills | Status: AC
Start: 2021-03-07 — End: 2021-03-12

## 2021-03-07 MED ORDER — CITALOPRAM 40 MG TAB
40 mg | ORAL_TABLET | Freq: Every day | ORAL | 1 refills | Status: AC
Start: 2021-03-07 — End: ?

## 2021-03-07 NOTE — Progress Notes (Signed)
Chief Complaint   Patient presents with    Establish Care     3 most recent PHQ Screens 03/07/2021   Little interest or pleasure in doing things Not at all   Feeling down, depressed, irritable, or hopeless Not at all   Total Score PHQ 2 0     Abuse Screening Questionnaire 03/07/2021   Do you ever feel afraid of your partner? N   Are you in a relationship with someone who physically or mentally threatens you? N   Is it safe for you to go home? Y     Visit Vitals  BP 109/71 (BP 1 Location: Right upper arm, BP Patient Position: Sitting, BP Cuff Size: Small adult)   Pulse 92   Temp 98.2 F (36.8 C) (Temporal)   Resp 16   Ht 5\' 2"  (1.575 m)   Wt 183 lb 3.2 oz (83.1 kg)   SpO2 98%   BMI 33.51 kg/m     1. "Have you been to the ER, urgent care clinic since your last visit?  Hospitalized since your last visit?" no    2. "Have you seen or consulted any other health care providers outside of the [facility] since your last visit?" OBGYN         5. For patients aged 21-65: Has the patient had a pap smear? Up to date

## 2021-03-07 NOTE — Progress Notes (Signed)
HPI     Chief Complaint   Patient presents with    Establish Care     She is a 35 y.o. female who presents to establish care.  Pmhx : IBS, Anxiety, Vit D def, hypothyroidism.  Following with her GYN for weight management and mild hypothyroidism.      Question of sleep apnea, but she did not have follow up sleep study performed. Since started treatment for hypothyroidism she feels symptoms of fatigue and sleepiness have resolved.     C/o cough and congestion for about a week. No sore throat or fever.   Some malaise at onset, improved but still persisting.     Chronic anxiety, using celexa daily. Feels this works well for her.  Using xanax prn, mainly for social anxiety, usually 1-2 days per month. Uses wine 1-2 days per week.   She is exercising regularly.  Diet is overall healthy.  Sleeping well.      Establishing Care  - Chronic medical problems:  Past Medical History:   Diagnosis Date    Allergic rhinitis     Anxiety 09/26/2009    Depression with anxiety     Gallstones     GERD (gastroesophageal reflux disease)     Gyn exam     Dr. Rocco Serene - [place] PAPs    IBS (irritable bowel syndrome)     Thyroid disease      - Current medications:   Current Outpatient Medications   Medication Sig    thyroid, Pork, (ARMOUR) 30 mg tablet Take  by mouth daily.    citalopram (CELEXA) 40 mg tablet TAKE 1 TABLET BY MOUTH ONE TIME DAILY    ALPRAZolam (XANAX) 0.25 mg tablet Take 1 Tab by mouth two (2) times daily as needed for Anxiety. Max Daily Amount: 0.5 mg.    phentermine (ADIPEX-P) 37.5 mg tablet Take 37.5 mg by mouth daily.    rifAXIMin (XIFAXAN) 550 mg tablet Xifaxan 550 mg tablet   Take 1 tablet 3 times a day by oral route as directed for 14 days. (Patient not taking: Reported on 03/07/2021)    ibuprofen (MOTRIN IB) 200 mg tablet Take  by mouth.     No current facility-administered medications for this visit.     - Family history:   Family History   Problem Relation Age of Onset    Cancer Mother         Breast Cancer     Hypertension Mother     Hypertension Father      - Allergies:   Allergies   Allergen Reactions    Pcn [Penicillins] Rash     - Surgical history:   Past Surgical History:   Procedure Laterality Date    HX HEENT      wisdom teeth removed    HX LAP CHOLECYSTECTOMY  04/25/2016    by Dr. Theodoro Doing WISDOM TEETH EXTRACTION      no complications     - Social history (sexually active, occupation, smoker, etoh use, etc):   Social History     Socioeconomic History    Marital status: MARRIED     Spouse name: Not on file    Number of children: Not on file    Years of education: Not on file    Highest education level: Not on file   Occupational History    Not on file   Tobacco Use    Smoking status: Never    Smokeless tobacco: Never  Substance and Sexual Activity    Alcohol use: Yes     Comment: 1-2 weekly     Drug use: No    Sexual activity: Yes     Partners: Male     Birth control/protection: Pill   Other Topics Concern    Not on file   Social History Narrative    Not on file     Social Determinants of Health     Financial Resource Strain: Low Risk     Difficulty of Paying Living Expenses: Not hard at all   Food Insecurity: No Food Insecurity    Worried About Programme researcher, broadcasting/film/video in the Last Year: Never true    Barista in the Last Year: Never true   Transportation Needs: Not on file   Physical Activity: Insufficiently Active    Days of Exercise per Week: 4 days    Minutes of Exercise per Session: 30 min   Stress: Not on file   Social Connections: Not on file   Intimate Partner Violence: Not At Risk    Fear of Current or Ex-Partner: No    Emotionally Abused: No    Physically Abused: No    Sexually Abused: No   Housing Stability: Not on file         Review of Systems  Denies fever, chills, chest pain, shortness of breath, abd pain, nausea, vomiting    Reviewed PmHx, RxHx, FmHx, SocHx, AllgHx and updated and dated in the chart.    Physical Exam:  Visit Vitals  BP 109/71 (BP 1 Location: Right upper arm, BP Patient  Position: Sitting, BP Cuff Size: Small adult)   Pulse 92   Temp 98.2 ??F (36.8 ??C) (Temporal)   Resp 16   Ht 5\' 2"  (1.575 m)   Wt 183 lb 3.2 oz (83.1 kg)   LMP 02/17/2021   SpO2 98%   BMI 33.51 kg/m??     Physical Exam  Vitals reviewed.   Constitutional:       Appearance: Normal appearance.   HENT:      Head: Normocephalic and atraumatic.      Right Ear: Tympanic membrane, ear canal and external ear normal.      Left Ear: Tympanic membrane, ear canal and external ear normal.      Nose:      Comments: Mild nasal mucosal bogginess. No drainage     Mouth/Throat:      Mouth: Mucous membranes are moist.      Pharynx: Oropharynx is clear.   Eyes:      Extraocular Movements: Extraocular movements intact.      Conjunctiva/sclera: Conjunctivae normal.      Pupils: Pupils are equal, round, and reactive to light.   Cardiovascular:      Rate and Rhythm: Normal rate and regular rhythm.      Pulses: Normal pulses.      Heart sounds: Normal heart sounds.   Pulmonary:      Effort: Pulmonary effort is normal.      Breath sounds: Normal breath sounds.   Musculoskeletal:      Cervical back: Neck supple. No tenderness.      Right lower leg: No edema.      Left lower leg: No edema.   Lymphadenopathy:      Cervical: No cervical adenopathy.   Skin:     General: Skin is warm and dry.   Neurological:      General: No focal deficit present.  Mental Status: She is alert and oriented to person, place, and time.   Psychiatric:         Mood and Affect: Mood normal.         Behavior: Behavior normal.         Thought Content: Thought content normal.            Assessment / Plan     Diagnoses and all orders for this visit:    1. Acute laryngitis : advised likely viral etiology vs allergy mediated symptoms. Try daily flonase x 1-2 weeks. Given abx to hold and take if not improved in the next few days. Seek care if condition worsens.    2. Anxiety : discussed potential harms of benzo use including but not limited to addiction, sedation, med  interatction. Use only prn and lowest effective dose. Pmp verified, appropriate. Follow up 6 months or sooner prn.  -     ALPRAZolam (XANAX) 0.5 mg tablet; Take 1 Tablet by mouth two (2) times daily as needed for Anxiety. Max Daily Amount: 1 mg.  -     citalopram (CELEXA) 40 mg tablet; Take 1 Tablet by mouth daily.    Other orders  -     azithromycin (ZITHROMAX) 250 mg tablet; Take 2 tablets today, then take 1 tablet daily           I have discussed the diagnosis with the patient and the intended plan as seen in the above orders.  I have discussed medication side effects and warnings with the patient as well.    Eugenie Birks, DO

## 2021-11-06 MED ORDER — CITALOPRAM HYDROBROMIDE 40 MG PO TABS
40 MG | ORAL_TABLET | ORAL | 0 refills | Status: DC
Start: 2021-11-06 — End: 2021-11-17

## 2021-11-06 NOTE — Telephone Encounter (Signed)
LOV 03/07/21

## 2021-11-17 ENCOUNTER — Ambulatory Visit: Admit: 2021-11-17 | Payer: BLUE CROSS/BLUE SHIELD | Attending: Family Medicine | Primary: Family Medicine

## 2021-11-17 DIAGNOSIS — E559 Vitamin D deficiency, unspecified: Secondary | ICD-10-CM

## 2021-11-17 MED ORDER — CITALOPRAM HYDROBROMIDE 40 MG PO TABS
40 MG | ORAL_TABLET | Freq: Every day | ORAL | 1 refills | Status: DC
Start: 2021-11-17 — End: 2022-08-21

## 2021-11-17 MED ORDER — THYROID 30 MG PO TABS
30 MG | ORAL_TABLET | Freq: Every day | ORAL | 1 refills | Status: AC
Start: 2021-11-17 — End: ?

## 2021-11-17 NOTE — Progress Notes (Signed)
Chief Complaint   Patient presents with    Annual Exam       BP 103/64 (Site: Left Upper Arm, Position: Sitting, Cuff Size: Small Adult)   Pulse 83   Temp 97.8 F (36.6 C) (Temporal)   Resp 14   Ht 5\' 2"  (1.575 m)   Wt 194 lb (88 kg)   SpO2 98%   BMI 35.48 kg/m     1. Have you been to the ER, urgent care clinic since your last visit?  Hospitalized since your last visit?no    2. Have you seen or consulted any other health care providers outside of the [facility] since your last visit?  Include any pap smears or colon screening. Obgyn         If the patient is female:      5. For patients aged 21-65: Has the patient had a pap smear?DR 09-06[birth year]1988  [facility] DR.

## 2021-11-17 NOTE — Progress Notes (Signed)
11/17/2021    Erin Townsend (DOB:  [birth year]) is a 36 y.o. female, here for a preventive medicine evaluation.    Pmhx : IBS, Anxiety, Vit D def, hypothyroidism.  Following with her GYN for weight management and mild hypothyroidism.      Hypothyroidism, compliant with medication.    Chronic anxiety, using celexa daily. Feels this works well for her. Recently mood is overall doing well.  Using xanax prn, mainly for social anxiety, usually 1-2 days per month. Uses wine 1-2 days per week.   Denies depressed mood.     Admits to recent unhealthy diet.  She is not exercising regularly.     Pap smear performed at gyn 2 weeks ago.     She is on Vit D supplement daily but cant remember the dose.     She is following with a weight loss clinic. On phentermine and considering wegovy.     Patient Active Problem List   Diagnosis    Allergic rhinitis    Milk intolerance    IBS (irritable bowel syndrome)    Cholelithiasis    Anxiety    Chronic cholecystitis    Neck pain       Review of Systems   Constitutional:  Negative for appetite change and fever.   Respiratory:  Negative for chest tightness and shortness of breath.    Cardiovascular:  Negative for chest pain.   Gastrointestinal:  Negative for abdominal pain, blood in stool, diarrhea and vomiting.   Neurological:  Negative for syncope.     Prior to Visit Medications    Medication Sig Taking? Authorizing Provider   citalopram (CELEXA) 40 MG tablet TAKE 1 TABLET BY MOUTH ONE TIME DAILY  Emmaline F Shelnutt, APRN - NP   ALPRAZolam (XANAX) 0.5 MG tablet Take 1 tablet by mouth 2 times daily as needed. Max Daily Amount: 1 mg  Ar Automatic Reconciliation   phentermine (ADIPEX-P) 37.5 MG tablet Take 1 tablet by mouth daily. Max Daily Amount: 37.5 mg  Ar Automatic Reconciliation   thyroid (ARMOUR) 30 MG tablet Take by mouth daily  Ar Automatic Reconciliation        Allergies   Allergen Reactions    Penicillins Rash       Past Medical History:   Diagnosis Date    Allergic rhinitis      Anxiety 09/26/2009    Depression with anxiety     Gallstones     GERD (gastroesophageal reflux disease)     IBS (irritable bowel syndrome)     Thyroid disease        Past Surgical History:   Procedure Laterality Date    CHOLECYSTECTOMY, LAPAROSCOPIC  04/25/2016    by Dr. Verne Grain    HEENT      wisdom teeth removed    WISDOM TOOTH EXTRACTION      no complications       Social History     Socioeconomic History    Marital status: Married     Spouse name: Not on file    Number of children: Not on file    Years of education: Not on file    Highest education level: Not on file   Occupational History    Not on file   Tobacco Use    Smoking status: Never    Smokeless tobacco: Never   Substance and Sexual Activity    Alcohol use: Yes    Drug use: No    Sexual activity: Not on file  Other Topics Concern    Not on file   Social History Narrative    Not on file     Social Determinants of Health     Financial Resource Strain: Low Risk     Difficulty of Paying Living Expenses: Not hard at all   Food Insecurity: No Food Insecurity    Worried About Programme researcher, broadcasting/film/video in the Last Year: Never true    Barista in the Last Year: Never true   Transportation Needs: Not on file   Physical Activity: Insufficiently Active    Days of Exercise per Week: 4 days    Minutes of Exercise per Session: 30 min   Stress: Not on file   Social Connections: Not on file   Intimate Partner Violence: Not At Risk    Fear of Current or Ex-Partner: No    Emotionally Abused: No    Physically Abused: No    Sexually Abused: No   Housing Stability: Not on file        Family History   Problem Relation Age of Onset    Cancer Mother         Breast Cancer    Hypertension Father     Hypertension Mother        ADVANCE DIRECTIVE: N, <no information>    There were no vitals filed for this visit.  Estimated body mass index is 33.51 kg/m as calculated from the following:    Height as of 03/07/21: 5\' 2"  (1.575 m).    Weight as of 03/07/21: 183 lb 3.2 oz (83.1  kg).    Physical Exam  Vitals reviewed.   Constitutional:       Appearance: Normal appearance.   HENT:      Head: Normocephalic and atraumatic.      Right Ear: Tympanic membrane, ear canal and external ear normal.      Left Ear: Tympanic membrane, ear canal and external ear normal.      Mouth/Throat:      Mouth: Mucous membranes are moist.      Pharynx: Oropharynx is clear.   Eyes:      Extraocular Movements: Extraocular movements intact.      Conjunctiva/sclera: Conjunctivae normal.      Pupils: Pupils are equal, round, and reactive to light.   Cardiovascular:      Rate and Rhythm: Normal rate.      Pulses: Normal pulses.      Heart sounds: Normal heart sounds.   Pulmonary:      Effort: Pulmonary effort is normal.      Breath sounds: Normal breath sounds.   Abdominal:      General: Bowel sounds are normal. There is no distension.      Palpations: Abdomen is soft. There is no mass.      Tenderness: There is no abdominal tenderness.   Musculoskeletal:      Cervical back: Neck supple. No tenderness.      Right lower leg: No edema.      Left lower leg: No edema.   Lymphadenopathy:      Cervical: No cervical adenopathy.   Skin:     General: Skin is warm and dry.   Neurological:      General: No focal deficit present.      Mental Status: She is alert.   Psychiatric:         Mood and Affect: Mood normal.         Behavior:  Behavior normal.         Thought Content: Thought content normal.       Lab Results   Component Value Date/Time    CHOL 156 07/30/2019 08:36 AM    TRIG 144 07/30/2019 08:36 AM    HDL 39 07/30/2019 08:36 AM    LDLCALC 88.2 07/30/2019 08:36 AM    GLUCOSE 95 07/30/2019 08:36 AM    LABA1C 5.5 07/30/2019 08:36 AM       The ASCVD Risk score (Arnett DK, et al., 2019) failed to calculate for the following reasons:    The 2019 ASCVD risk score is only valid for ages 73 to 68    Immunization History   Administered Date(s) Administered    COVID-19, PFIZER PURPLE top, DILUTE for use, (age 57 y+), 53mcg/0.3mL  10/16/2019, 11/13/2019    Influenza Virus Vaccine 04/18/2011, 04/04/2017, 04/01/2019    Influenza, FLUARIX, FLULAVAL, FLUZONE (age 79 mo+) AND AFLURIA, (age 41 y+), PF, 0.22mL 04/17/2016    Influenza, Trivalent, Recombinant, Injectable vaccine, PF 02/10/2014    TDaP, ADACEL (age 5y-64y), BOOSTRIX (age 10y+), IM, 0.71mL 08/27/2015       Health Maintenance   Topic Date Due    Varicella vaccine (1 of 2 - 2-dose childhood series) Never done    HIV screen  Never done    Cervical cancer screen  Never done    COVID-19 Vaccine (3 - Booster for Pfizer series) 01/08/2020    Flu vaccine (Season Ended) 12/26/2021    Depression Screen  03/07/2022    DTaP/Tdap/Td vaccine (2 - Td or Tdap) 08/26/2025    Hepatitis C screen  Completed    Hepatitis A vaccine  Aged Out    Hib vaccine  Aged Out    Meningococcal (ACWY) vaccine  Aged Out    Pneumococcal 0-64 years Vaccine  Aged Out       Assessment & Plan   Vitamin D deficiency  -     Vitamin D 25 Hydroxy; Future  Well woman exam (no gynecological exam)  -     Lipid Panel; Future  -     Comprehensive Metabolic Panel; Future  -     Hemoglobin A1C; Future  Acquired hypothyroidism  -     Hemoglobin A1C; Future  -     TSH; Future  Request pap results.  Encouraged hh diet and regular exercise.  She declines STI screening.             --Eugenie Birks, DO

## 2021-11-20 ENCOUNTER — Encounter

## 2021-11-21 LAB — COMPREHENSIVE METABOLIC PANEL
ALT: 21 U/L (ref 12–78)
AST: 14 U/L — ABNORMAL LOW (ref 15–37)
Albumin/Globulin Ratio: 1.1 (ref 1.1–2.2)
Albumin: 3.8 g/dL (ref 3.5–5.0)
Alk Phosphatase: 66 U/L (ref 45–117)
Anion Gap: 3 mmol/L — ABNORMAL LOW (ref 5–15)
BUN: 15 MG/DL (ref 6–20)
Bun/Cre Ratio: 20 (ref 12–20)
CO2: 30 mmol/L (ref 21–32)
Calcium: 9.2 MG/DL (ref 8.5–10.1)
Chloride: 106 mmol/L (ref 97–108)
Creatinine: 0.75 MG/DL (ref 0.55–1.02)
Est, Glom Filt Rate: >60 mL/min/{1.73_m2} (ref >60)
Globulin: 3.4 g/dL (ref 2.0–4.0)
Glucose: 98 mg/dL (ref 65–100)
Potassium: 4.3 mmol/L (ref 3.5–5.1)
Sodium: 139 mmol/L (ref 136–145)
Total Bilirubin: 0.2 MG/DL (ref 0.2–1.0)
Total Protein: 7.2 g/dL (ref 6.4–8.2)

## 2021-11-21 LAB — HEMOGLOBIN A1C
Hemoglobin A1C: 4.8 % (ref 4.0–5.6)
eAG: 91 mg/dL

## 2021-11-21 LAB — LIPID PANEL
Chol/HDL Ratio: 4.2 (ref 0.0–5.0)
Cholesterol, Total: 168 MG/DL (ref <200)
HDL: 40 MG/DL
LDL Calculated: 103.6 MG/DL — ABNORMAL HIGH (ref 0–100)
Triglycerides: 122 MG/DL (ref <150)
VLDL Cholesterol Calculated: 24.4 MG/DL

## 2021-11-21 LAB — VITAMIN D 25 HYDROXY: Vit D, 25-Hydroxy: 58.8 ng/mL (ref 30–100)

## 2021-11-21 LAB — TSH: TSH, 3RD GENERATION: 0.61 u[IU]/mL (ref 0.36–3.74)

## 2022-02-08 ENCOUNTER — Ambulatory Visit: Payer: BLUE CROSS/BLUE SHIELD | Attending: Family Medicine | Primary: Family Medicine

## 2022-07-19 NOTE — Telephone Encounter (Signed)
Appointment Request From: Madelaine Etienne     With Provider: Lisa Roca, DO [ [facility]]     Preferred Date Range: 07/19/2022 - 07/19/2022     Preferred Times: Any Time     Reason for visit: Office Visit     Comments:  sick visit, any provider is fine or if not available virtual visit.

## 2022-07-24 NOTE — Telephone Encounter (Signed)
Left message to call office.     Checking on patient.

## 2022-08-21 MED ORDER — CITALOPRAM HYDROBROMIDE 40 MG PO TABS
40 | ORAL_TABLET | Freq: Every day | ORAL | 0 refills | Status: AC
Start: 2022-08-21 — End: ?

## 2022-10-02 NOTE — Telephone Encounter (Signed)
PCP: Eugenie Birks, DO    Last Visit 11/17/2021   No future appointments.    Requested Prescriptions     Pending Prescriptions Disp Refills    ALPRAZolam (XANAX) 0.5 MG tablet       Sig: Take 1 tablet by mouth 2 times daily as needed. Max Daily Amount: 1 mg         Other Comments: Last Refill   03/07/21

## 2022-10-09 ENCOUNTER — Ambulatory Visit: Payer: BLUE CROSS/BLUE SHIELD | Attending: Family Medicine | Primary: Family Medicine

## 2022-11-26 NOTE — Telephone Encounter (Signed)
PCP: Eugenie Birks, DO    Last Visit 11/17/2021   No future appointments.    Requested Prescriptions     Pending Prescriptions Disp Refills    citalopram (CELEXA) 40 MG tablet 90 tablet 0     Sig: Take 1 tablet by mouth daily Follow up for refills         Other Comments: Last Refill   08/21/22
